# Patient Record
Sex: Female | Born: 1980 | Race: White | Hispanic: No | Marital: Single | Smoking: Current every day smoker
Health system: Southern US, Community
[De-identification: ages and names within clinical notes are randomized; demographics above are authoritative.]

## PROBLEM LIST (undated history)

## (undated) ENCOUNTER — Inpatient Hospital Stay (HOSPITAL_COMMUNITY): Payer: Self-pay

## (undated) DIAGNOSIS — J45909 Unspecified asthma, uncomplicated: Secondary | ICD-10-CM

## (undated) DIAGNOSIS — R011 Cardiac murmur, unspecified: Secondary | ICD-10-CM

## (undated) DIAGNOSIS — F1910 Other psychoactive substance abuse, uncomplicated: Secondary | ICD-10-CM

## (undated) DIAGNOSIS — R569 Unspecified convulsions: Secondary | ICD-10-CM

## (undated) HISTORY — PX: FINGER SURGERY: SHX640

## (undated) HISTORY — DX: Cardiac murmur, unspecified: R01.1

## (undated) HISTORY — PX: CYSTECTOMY: SUR359

---

## 1999-03-22 ENCOUNTER — Emergency Department (HOSPITAL_COMMUNITY): Admission: EM | Admit: 1999-03-22 | Discharge: 1999-03-23 | Payer: Self-pay | Admitting: Internal Medicine

## 1999-10-30 ENCOUNTER — Emergency Department (HOSPITAL_COMMUNITY): Admission: EM | Admit: 1999-10-30 | Discharge: 1999-10-30 | Payer: Self-pay | Admitting: Emergency Medicine

## 2000-07-07 ENCOUNTER — Emergency Department (HOSPITAL_COMMUNITY): Admission: EM | Admit: 2000-07-07 | Discharge: 2000-07-07 | Payer: Self-pay | Admitting: Emergency Medicine

## 2000-11-28 ENCOUNTER — Emergency Department (HOSPITAL_COMMUNITY): Admission: EM | Admit: 2000-11-28 | Discharge: 2000-11-28 | Payer: Self-pay | Admitting: Emergency Medicine

## 2002-01-19 ENCOUNTER — Inpatient Hospital Stay (HOSPITAL_COMMUNITY): Admission: AD | Admit: 2002-01-19 | Discharge: 2002-01-19 | Payer: Self-pay | Admitting: *Deleted

## 2002-03-19 ENCOUNTER — Inpatient Hospital Stay (HOSPITAL_COMMUNITY): Admission: AD | Admit: 2002-03-19 | Discharge: 2002-03-19 | Payer: Self-pay | Admitting: *Deleted

## 2002-06-02 ENCOUNTER — Inpatient Hospital Stay (HOSPITAL_COMMUNITY): Admission: AD | Admit: 2002-06-02 | Discharge: 2002-06-04 | Payer: Self-pay | Admitting: *Deleted

## 2002-06-02 ENCOUNTER — Encounter: Payer: Self-pay | Admitting: General Surgery

## 2002-06-03 ENCOUNTER — Encounter: Payer: Self-pay | Admitting: Obstetrics

## 2002-07-23 ENCOUNTER — Inpatient Hospital Stay (HOSPITAL_COMMUNITY): Admission: AD | Admit: 2002-07-23 | Discharge: 2002-07-23 | Payer: Self-pay | Admitting: Obstetrics

## 2002-09-05 ENCOUNTER — Inpatient Hospital Stay (HOSPITAL_COMMUNITY): Admission: RE | Admit: 2002-09-05 | Discharge: 2002-09-09 | Payer: Self-pay | Admitting: Obstetrics

## 2002-09-05 ENCOUNTER — Encounter: Payer: Self-pay | Admitting: Obstetrics

## 2002-09-06 DIAGNOSIS — O1490 Unspecified pre-eclampsia, unspecified trimester: Secondary | ICD-10-CM

## 2002-10-11 ENCOUNTER — Encounter: Payer: Self-pay | Admitting: Infectious Diseases

## 2002-10-11 ENCOUNTER — Inpatient Hospital Stay (HOSPITAL_COMMUNITY): Admission: EM | Admit: 2002-10-11 | Discharge: 2002-10-12 | Payer: Self-pay | Admitting: Emergency Medicine

## 2002-10-12 ENCOUNTER — Inpatient Hospital Stay (HOSPITAL_COMMUNITY): Admission: EM | Admit: 2002-10-12 | Discharge: 2002-10-16 | Payer: Self-pay | Admitting: Psychiatry

## 2003-03-29 ENCOUNTER — Emergency Department (HOSPITAL_COMMUNITY): Admission: EM | Admit: 2003-03-29 | Discharge: 2003-03-30 | Payer: Self-pay | Admitting: Emergency Medicine

## 2003-07-18 ENCOUNTER — Emergency Department (HOSPITAL_COMMUNITY): Admission: EM | Admit: 2003-07-18 | Discharge: 2003-07-18 | Payer: Self-pay | Admitting: Emergency Medicine

## 2003-07-21 ENCOUNTER — Emergency Department (HOSPITAL_COMMUNITY): Admission: EM | Admit: 2003-07-21 | Discharge: 2003-07-21 | Payer: Self-pay | Admitting: Emergency Medicine

## 2003-07-22 ENCOUNTER — Emergency Department (HOSPITAL_COMMUNITY): Admission: EM | Admit: 2003-07-22 | Discharge: 2003-07-22 | Payer: Self-pay | Admitting: Emergency Medicine

## 2003-08-22 ENCOUNTER — Emergency Department (HOSPITAL_COMMUNITY): Admission: EM | Admit: 2003-08-22 | Discharge: 2003-08-22 | Payer: Self-pay | Admitting: Emergency Medicine

## 2012-07-15 DIAGNOSIS — F1910 Other psychoactive substance abuse, uncomplicated: Secondary | ICD-10-CM | POA: Insufficient documentation

## 2012-07-15 DIAGNOSIS — J45909 Unspecified asthma, uncomplicated: Secondary | ICD-10-CM | POA: Insufficient documentation

## 2014-05-15 DIAGNOSIS — E890 Postprocedural hypothyroidism: Secondary | ICD-10-CM | POA: Insufficient documentation

## 2014-09-17 DIAGNOSIS — F988 Other specified behavioral and emotional disorders with onset usually occurring in childhood and adolescence: Secondary | ICD-10-CM | POA: Insufficient documentation

## 2014-09-17 DIAGNOSIS — R569 Unspecified convulsions: Secondary | ICD-10-CM | POA: Insufficient documentation

## 2014-09-17 DIAGNOSIS — Z720 Tobacco use: Secondary | ICD-10-CM | POA: Insufficient documentation

## 2014-09-17 DIAGNOSIS — F319 Bipolar disorder, unspecified: Secondary | ICD-10-CM | POA: Insufficient documentation

## 2015-04-30 ENCOUNTER — Other Ambulatory Visit (HOSPITAL_COMMUNITY): Payer: Self-pay | Admitting: Nurse Practitioner

## 2015-05-01 ENCOUNTER — Encounter (HOSPITAL_COMMUNITY): Payer: Self-pay

## 2015-05-01 ENCOUNTER — Other Ambulatory Visit (HOSPITAL_COMMUNITY): Payer: Self-pay | Admitting: Nurse Practitioner

## 2015-05-01 ENCOUNTER — Ambulatory Visit (HOSPITAL_COMMUNITY)
Admission: RE | Admit: 2015-05-01 | Discharge: 2015-05-01 | Disposition: A | Discharge status: Home / Self Care | Payer: Medicaid Other | Source: Ambulatory Visit | Attending: Family Medicine | Admitting: Family Medicine

## 2015-05-01 DIAGNOSIS — O0932 Supervision of pregnancy with insufficient antenatal care, second trimester: Secondary | ICD-10-CM | POA: Diagnosis not present

## 2015-05-01 DIAGNOSIS — F1110 Opioid abuse, uncomplicated: Secondary | ICD-10-CM

## 2015-05-01 DIAGNOSIS — O09522 Supervision of elderly multigravida, second trimester: Secondary | ICD-10-CM

## 2015-05-01 DIAGNOSIS — O99332 Smoking (tobacco) complicating pregnancy, second trimester: Secondary | ICD-10-CM

## 2015-05-01 DIAGNOSIS — Z3A15 15 weeks gestation of pregnancy: Secondary | ICD-10-CM

## 2015-05-01 DIAGNOSIS — Z3A19 19 weeks gestation of pregnancy: Secondary | ICD-10-CM

## 2015-05-01 DIAGNOSIS — O99312 Alcohol use complicating pregnancy, second trimester: Secondary | ICD-10-CM

## 2015-05-01 DIAGNOSIS — Z3492 Encounter for supervision of normal pregnancy, unspecified, second trimester: Secondary | ICD-10-CM

## 2015-05-01 DIAGNOSIS — O34211 Maternal care for low transverse scar from previous cesarean delivery: Secondary | ICD-10-CM

## 2015-05-01 DIAGNOSIS — O99352 Diseases of the nervous system complicating pregnancy, second trimester: Secondary | ICD-10-CM

## 2015-05-01 DIAGNOSIS — O99322 Drug use complicating pregnancy, second trimester: Secondary | ICD-10-CM

## 2015-05-01 DIAGNOSIS — Z36 Encounter for antenatal screening of mother: Secondary | ICD-10-CM | POA: Insufficient documentation

## 2015-05-01 DIAGNOSIS — Z3689 Encounter for other specified antenatal screening: Secondary | ICD-10-CM

## 2015-05-01 HISTORY — DX: Unspecified asthma, uncomplicated: J45.909

## 2015-05-01 HISTORY — DX: Other psychoactive substance abuse, uncomplicated: F19.10

## 2015-05-01 HISTORY — DX: Unspecified convulsions: R56.9

## 2015-05-01 NOTE — ED Notes (Signed)
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 Reports mild cramping at times.

## 2015-05-20 ENCOUNTER — Encounter: Payer: Medicaid Other | Admitting: Obstetrics & Gynecology

## 2015-05-23 ENCOUNTER — Ambulatory Visit (HOSPITAL_COMMUNITY): Payer: Medicaid Other | Attending: Family Medicine

## 2015-05-23 ENCOUNTER — Encounter (HOSPITAL_COMMUNITY): Payer: Medicaid Other

## 2015-05-30 ENCOUNTER — Encounter: Payer: Self-pay | Admitting: Family

## 2015-05-30 ENCOUNTER — Encounter: Payer: Medicaid Other | Admitting: Family

## 2015-07-15 ENCOUNTER — Other Ambulatory Visit (HOSPITAL_COMMUNITY)
Admission: RE | Admit: 2015-07-15 | Discharge: 2015-07-15 | Disposition: A | Discharge status: Home / Self Care | Payer: Medicaid Other | Source: Ambulatory Visit | Attending: Family Medicine | Admitting: Family Medicine

## 2015-07-15 ENCOUNTER — Encounter: Payer: Self-pay | Admitting: Family Medicine

## 2015-07-15 ENCOUNTER — Ambulatory Visit (INDEPENDENT_AMBULATORY_CARE_PROVIDER_SITE_OTHER): Payer: Medicaid Other | Admitting: Family Medicine

## 2015-07-15 VITALS — BP 104/46 | HR 63 | Temp 98.2°F | Ht 63.0 in | Wt 164.0 lb

## 2015-07-15 DIAGNOSIS — Z23 Encounter for immunization: Secondary | ICD-10-CM | POA: Diagnosis not present

## 2015-07-15 DIAGNOSIS — O0990 Supervision of high risk pregnancy, unspecified, unspecified trimester: Secondary | ICD-10-CM | POA: Insufficient documentation

## 2015-07-15 DIAGNOSIS — O99320 Drug use complicating pregnancy, unspecified trimester: Secondary | ICD-10-CM | POA: Insufficient documentation

## 2015-07-15 DIAGNOSIS — Z1151 Encounter for screening for human papillomavirus (HPV): Secondary | ICD-10-CM | POA: Diagnosis not present

## 2015-07-15 DIAGNOSIS — O99350 Diseases of the nervous system complicating pregnancy, unspecified trimester: Secondary | ICD-10-CM

## 2015-07-15 DIAGNOSIS — O09522 Supervision of elderly multigravida, second trimester: Secondary | ICD-10-CM | POA: Diagnosis not present

## 2015-07-15 DIAGNOSIS — O99352 Diseases of the nervous system complicating pregnancy, second trimester: Secondary | ICD-10-CM | POA: Diagnosis not present

## 2015-07-15 DIAGNOSIS — O34219 Maternal care for unspecified type scar from previous cesarean delivery: Secondary | ICD-10-CM | POA: Insufficient documentation

## 2015-07-15 DIAGNOSIS — G40909 Epilepsy, unspecified, not intractable, without status epilepticus: Secondary | ICD-10-CM | POA: Diagnosis not present

## 2015-07-15 DIAGNOSIS — O99322 Drug use complicating pregnancy, second trimester: Secondary | ICD-10-CM

## 2015-07-15 DIAGNOSIS — Z124 Encounter for screening for malignant neoplasm of cervix: Secondary | ICD-10-CM | POA: Diagnosis not present

## 2015-07-15 DIAGNOSIS — Z01419 Encounter for gynecological examination (general) (routine) without abnormal findings: Secondary | ICD-10-CM | POA: Diagnosis present

## 2015-07-15 DIAGNOSIS — Z113 Encounter for screening for infections with a predominantly sexual mode of transmission: Secondary | ICD-10-CM | POA: Insufficient documentation

## 2015-07-15 DIAGNOSIS — O09529 Supervision of elderly multigravida, unspecified trimester: Secondary | ICD-10-CM | POA: Insufficient documentation

## 2015-07-15 DIAGNOSIS — O0992 Supervision of high risk pregnancy, unspecified, second trimester: Secondary | ICD-10-CM

## 2015-07-15 LAB — POCT URINALYSIS DIP (DEVICE)
BILIRUBIN URINE: NEGATIVE
Glucose, UA: NEGATIVE mg/dL
Hgb urine dipstick: NEGATIVE
Ketones, ur: NEGATIVE mg/dL
NITRITE: NEGATIVE
PH: 6 (ref 5.0–8.0)
PROTEIN: NEGATIVE mg/dL
Specific Gravity, Urine: 1.01 (ref 1.005–1.030)
UROBILINOGEN UA: 0.2 mg/dL (ref 0.0–1.0)

## 2015-07-15 LAB — OB RESULTS CONSOLE GBS: GBS: POSITIVE

## 2015-07-15 MED ORDER — TETANUS-DIPHTH-ACELL PERTUSSIS 5-2.5-18.5 LF-MCG/0.5 IM SUSP
0.5000 mL | Freq: Once | INTRAMUSCULAR | Status: AC
  Administered 2015-07-15: 0.5 mL via INTRAMUSCULAR

## 2015-07-15 MED ORDER — PROMETHAZINE HCL 25 MG PO TABS: 25.0000 mg | ORAL_TABLET | Freq: Four times a day (QID) | ORAL | Status: AC | PRN

## 2015-07-15 MED ORDER — PRENATAL 27-0.8 MG PO TABS: 1.0000 | ORAL_TABLET | Freq: Every day | ORAL | Status: DC

## 2015-07-15 NOTE — Addendum Note (Signed)
Addended by: Reva BoresPRATT, Lealand Elting S on: 07/15/2015 10:53 AM   Modules accepted: Orders

## 2015-07-15 NOTE — Addendum Note (Signed)
Addended by: Kathee DeltonHILLMAN, Deaundra Dupriest L on: 07/15/2015 11:47 AM   Modules accepted: Orders

## 2015-07-15 NOTE — Progress Notes (Signed)
Reports a lot of nausea making it difficult to eat  New OB packet & 28 wk packet given

## 2015-07-15 NOTE — Patient Instructions (Signed)
Second Trimester of Pregnancy The second trimester is from week 13 through week 28, months 4 through 6. The second trimester is often a time when you feel your best. Your body has also adjusted to being pregnant, and you begin to feel better physically. Usually, morning sickness has lessened or quit completely, you may have more energy, and you may have an increase in appetite. The second trimester is also a time when the fetus is growing rapidly. At the end of the sixth month, the fetus is about 9 inches long and weighs about 1 pounds. You will likely begin to feel the baby move (quickening) between 18 and 20 weeks of the pregnancy. BODY CHANGES Your body goes through many changes during pregnancy. The changes vary from woman to woman.   Your weight will continue to increase. You will notice your lower abdomen bulging out.  You may begin to get stretch marks on your hips, abdomen, and breasts.  You may develop headaches that can be relieved by medicines approved by your health care provider.  You may urinate more often because the fetus is pressing on your bladder.  You may develop or continue to have heartburn as a result of your pregnancy.  You may develop constipation because certain hormones are causing the muscles that push waste through your intestines to slow down.  You may develop hemorrhoids or swollen, bulging veins (varicose veins).  You may have back pain because of the weight gain and pregnancy hormones relaxing your joints between the bones in your pelvis and as a result of a shift in weight and the muscles that support your balance.  Your breasts will continue to grow and be tender.  Your gums may bleed and may be sensitive to brushing and flossing.  Dark spots or blotches (chloasma, mask of pregnancy) may develop on your face. This will likely fade after the baby is born.  A dark line from your belly button to the pubic area (linea nigra) may appear. This will likely  fade after the baby is born.  You may have changes in your hair. These can include thickening of your hair, rapid growth, and changes in texture. Some women also have hair loss during or after pregnancy, or hair that feels dry or thin. Your hair will most likely return to normal after your baby is born. WHAT TO EXPECT AT YOUR PRENATAL VISITS During a routine prenatal visit:  You will be weighed to make sure you and the fetus are growing normally.  Your blood pressure will be taken.  Your abdomen will be measured to track your baby's growth.  The fetal heartbeat will be listened to.  Any test results from the previous visit will be discussed. Your health care provider may ask you:  How you are feeling.  If you are feeling the baby move.  If you have had any abnormal symptoms, such as leaking fluid, bleeding, severe headaches, or abdominal cramping.  If you are using any tobacco products, including cigarettes, chewing tobacco, and electronic cigarettes.  If you have any questions. Other tests that may be performed during your second trimester include:  Blood tests that check for:  Low iron levels (anemia).  Gestational diabetes (between 24 and 28 weeks).  Rh antibodies.  Urine tests to check for infections, diabetes, or protein in the urine.  An ultrasound to confirm the proper growth and development of the baby.  An amniocentesis to check for possible genetic problems.  Fetal screens for spina bifida   and Down syndrome.  HIV (human immunodeficiency virus) testing. Routine prenatal testing includes screening for HIV, unless you choose not to have this test. HOME CARE INSTRUCTIONS   Avoid all smoking, herbs, alcohol, and unprescribed drugs. These chemicals affect the formation and growth of the baby.  Do not use any tobacco products, including cigarettes, chewing tobacco, and electronic cigarettes. If you need help quitting, ask your health care provider. You may receive  counseling support and other resources to help you quit.  Follow your health care provider's instructions regarding medicine use. There are medicines that are either safe or unsafe to take during pregnancy.  Exercise only as directed by your health care provider. Experiencing uterine cramps is a good sign to stop exercising.  Continue to eat regular, healthy meals.  Wear a good support bra for breast tenderness.  Do not use hot tubs, steam rooms, or saunas.  Wear your seat belt at all times when driving.  Avoid raw meat, uncooked cheese, cat litter boxes, and soil used by cats. These carry germs that can cause birth defects in the baby.  Take your prenatal vitamins.  Take 1500-2000 mg of calcium daily starting at the 20th week of pregnancy until you deliver your baby.  Try taking a stool softener (if your health care provider approves) if you develop constipation. Eat more high-fiber foods, such as fresh vegetables or fruit and whole grains. Drink plenty of fluids to keep your urine clear or pale yellow.  Take warm sitz baths to soothe any pain or discomfort caused by hemorrhoids. Use hemorrhoid cream if your health care provider approves.  If you develop varicose veins, wear support hose. Elevate your feet for 15 minutes, 3-4 times a day. Limit salt in your diet.  Avoid heavy lifting, wear low heel shoes, and practice good posture.  Rest with your legs elevated if you have leg cramps or low back pain.  Visit your dentist if you have not gone yet during your pregnancy. Use a soft toothbrush to brush your teeth and be gentle when you floss.  A sexual relationship may be continued unless your health care provider directs you otherwise.  Continue to go to all your prenatal visits as directed by your health care provider. SEEK MEDICAL CARE IF:   You have dizziness.  You have mild pelvic cramps, pelvic pressure, or nagging pain in the abdominal area.  You have persistent nausea,  vomiting, or diarrhea.  You have a bad smelling vaginal discharge.  You have pain with urination. SEEK IMMEDIATE MEDICAL CARE IF:   You have a fever.  You are leaking fluid from your vagina.  You have spotting or bleeding from your vagina.  You have severe abdominal cramping or pain.  You have rapid weight gain or loss.  You have shortness of breath with chest pain.  You notice sudden or extreme swelling of your face, hands, ankles, feet, or legs.  You have not felt your baby move in over an hour.  You have severe headaches that do not go away with medicine.  You have vision changes.   This information is not intended to replace advice given to you by your health care provider. Make sure you discuss any questions you have with your health care provider.   Document Released: 06/23/2001 Document Revised: 07/20/2014 Document Reviewed: 08/30/2012 Elsevier Interactive Patient Education 2016 [employer]   Breastfeeding Deciding to breastfeed is one of the best choices you can make for you and your baby. A change   in hormones during pregnancy causes your breast tissue to grow and increases the number and size of your milk ducts. These hormones also allow proteins, sugars, and fats from your blood supply to make breast milk in your milk-producing glands. Hormones prevent breast milk from being released before your baby is born as well as prompt milk flow after birth. Once breastfeeding has begun, thoughts of your baby, as well as his or her sucking or crying, can stimulate the release of milk from your milk-producing glands.  BENEFITS OF BREASTFEEDING For Your Baby  Your first milk (colostrum) helps your baby's digestive system function better.  There are antibodies in your milk that help your baby fight off infections.  Your baby has a lower incidence of asthma, allergies, and sudden infant death syndrome.  The nutrients in breast milk are better for your baby than infant  formulas and are designed uniquely for your baby's needs.  Breast milk improves your baby's brain development.  Your baby is less likely to develop other conditions, such as childhood obesity, asthma, or type 2 diabetes mellitus. For You  Breastfeeding helps to create a very special bond between you and your baby.  Breastfeeding is convenient. Breast milk is always available at the correct temperature and costs nothing.  Breastfeeding helps to burn calories and helps you lose the weight gained during pregnancy.  Breastfeeding makes your uterus contract to its prepregnancy size faster and slows bleeding (lochia) after you give birth.   Breastfeeding helps to lower your risk of developing type 2 diabetes mellitus, osteoporosis, and breast or ovarian cancer later in life. SIGNS THAT YOUR BABY IS HUNGRY Early Signs of Hunger  Increased alertness or activity.  Stretching.  Movement of the head from side to side.  Movement of the head and opening of the mouth when the corner of the mouth or cheek is stroked (rooting).  Increased sucking sounds, smacking lips, cooing, sighing, or squeaking.  Hand-to-mouth movements.  Increased sucking of fingers or hands. Late Signs of Hunger  Fussing.  Intermittent crying. Extreme Signs of Hunger Signs of extreme hunger will require calming and consoling before your baby will be able to breastfeed successfully. Do not wait for the following signs of extreme hunger to occur before you initiate breastfeeding:  Restlessness.  A loud, strong cry.  Screaming. BREASTFEEDING BASICS Breastfeeding Initiation  Find a comfortable place to sit or lie down, with your neck and back well supported.  Place a pillow or rolled up blanket under your baby to bring him or her to the level of your breast (if you are seated). Nursing pillows are specially designed to help support your arms and your baby while you breastfeed.  Make sure that your baby's  abdomen is facing your abdomen.  Gently massage your breast. With your fingertips, massage from your chest wall toward your nipple in a circular motion. This encourages milk flow. You may need to continue this action during the feeding if your milk flows slowly.  Support your breast with 4 fingers underneath and your thumb above your nipple. Make sure your fingers are well away from your nipple and your baby's mouth.  Stroke your baby's lips gently with your finger or nipple.  When your baby's mouth is open wide enough, quickly bring your baby to your breast, placing your entire nipple and as much of the colored area around your nipple (areola) as possible into your baby's mouth.  More areola should be visible above your baby's upper lip than   below the lower lip.  Your baby's tongue should be between his or her lower gum and your breast.  Ensure that your baby's mouth is correctly positioned around your nipple (latched). Your baby's lips should create a seal on your breast and be turned out (everted).  It is common for your baby to suck about 2-3 minutes in order to start the flow of breast milk. Latching Teaching your baby how to latch on to your breast properly is very important. An improper latch can cause nipple pain and decreased milk supply for you and poor weight gain in your baby. Also, if your baby is not latched onto your nipple properly, he or she may swallow some air during feeding. This can make your baby fussy. Burping your baby when you switch breasts during the feeding can help to get rid of the air. However, teaching your baby to latch on properly is still the best way to prevent fussiness from swallowing air while breastfeeding. Signs that your baby has successfully latched on to your nipple:  Silent tugging or silent sucking, without causing you pain.  Swallowing heard between every 3-4 sucks.  Muscle movement above and in front of his or her ears while sucking. Signs  that your baby has not successfully latched on to nipple:  Sucking sounds or smacking sounds from your baby while breastfeeding.  Nipple pain. If you think your baby has not latched on correctly, slip your finger into the corner of your baby's mouth to break the suction and place it between your baby's gums. Attempt breastfeeding initiation again. Signs of Successful Breastfeeding Signs from your baby:  A gradual decrease in the number of sucks or complete cessation of sucking.  Falling asleep.  Relaxation of his or her body.  Retention of a small amount of milk in his or her mouth.  Letting go of your breast by himself or herself. Signs from you:  Breasts that have increased in firmness, weight, and size 1-3 hours after feeding.  Breasts that are softer immediately after breastfeeding.  Increased milk volume, as well as a change in milk consistency and color by the fifth day of breastfeeding.  Nipples that are not sore, cracked, or bleeding. Signs That Your Baby is Getting Enough Milk  Wetting at least 3 diapers in a 24-hour period. The urine should be clear and pale yellow by age 5 days.  At least 3 stools in a 24-hour period by age 5 days. The stool should be soft and yellow.  At least 3 stools in a 24-hour period by age 7 days. The stool should be seedy and yellow.  No loss of weight greater than 10% of birth weight during the first 3 days of age.  Average weight gain of 4-7 ounces (113-198 g) per week after age 4 days.  Consistent daily weight gain by age 5 days, without weight loss after the age of 2 weeks. After a feeding, your baby may spit up a small amount. This is common. BREASTFEEDING FREQUENCY AND DURATION Frequent feeding will help you make more milk and can prevent sore nipples and breast engorgement. Breastfeed when you feel the need to reduce the fullness of your breasts or when your baby shows signs of hunger. This is called "breastfeeding on demand." Avoid  introducing a pacifier to your baby while you are working to establish breastfeeding (the first 4-6 weeks after your baby is born). After this time you may choose to use a pacifier. Research has shown that   pacifier use during the first year of a baby's life decreases the risk of sudden infant death syndrome (SIDS). Allow your baby to feed on each breast as long as he or she wants. Breastfeed until your baby is finished feeding. When your baby unlatches or falls asleep while feeding from the first breast, offer the second breast. Because newborns are often sleepy in the first few weeks of life, you may need to awaken your baby to get him or her to feed. Breastfeeding times will vary from baby to baby. However, the following rules can serve as a guide to help you ensure that your baby is properly fed:  Newborns (babies 4 weeks of age or younger) may breastfeed every 1-3 hours.  Newborns should not go longer than 3 hours during the day or 5 hours during the night without breastfeeding.  You should breastfeed your baby a minimum of 8 times in a 24-hour period until you begin to introduce solid foods to your baby at around 6 months of age. BREAST MILK PUMPING Pumping and storing breast milk allows you to ensure that your baby is exclusively fed your breast milk, even at times when you are unable to breastfeed. This is especially important if you are going back to work while you are still breastfeeding or when you are not able to be present during feedings. Your lactation consultant can give you guidelines on how long it is safe to store breast milk. A breast pump is a machine that allows you to pump milk from your breast into a sterile bottle. The pumped breast milk can then be stored in a refrigerator or freezer. Some breast pumps are operated by hand, while others use electricity. Ask your lactation consultant which type will work best for you. Breast pumps can be purchased, but some hospitals and  breastfeeding support groups lease breast pumps on a monthly basis. A lactation consultant can teach you how to hand express breast milk, if you prefer not to use a pump. CARING FOR YOUR BREASTS WHILE YOU BREASTFEED Nipples can become dry, cracked, and sore while breastfeeding. The following recommendations can help keep your breasts moisturized and healthy:  Avoid using soap on your nipples.  Wear a supportive bra. Although not required, special nursing bras and tank tops are designed to allow access to your breasts for breastfeeding without taking off your entire bra or top. Avoid wearing underwire-style bras or extremely tight bras.  Air dry your nipples for 3-4minutes after each feeding.  Use only cotton bra pads to absorb leaked breast milk. Leaking of breast milk between feedings is normal.  Use lanolin on your nipples after breastfeeding. Lanolin helps to maintain your skin's normal moisture barrier. If you use pure lanolin, you do not need to wash it off before feeding your baby again. Pure lanolin is not toxic to your baby. You may also hand express a few drops of breast milk and gently massage that milk into your nipples and allow the milk to air dry. In the first few weeks after giving birth, some women experience extremely full breasts (engorgement). Engorgement can make your breasts feel heavy, warm, and tender to the touch. Engorgement peaks within 3-5 days after you give birth. The following recommendations can help ease engorgement:  Completely empty your breasts while breastfeeding or pumping. You may want to start by applying warm, moist heat (in the shower or with warm water-soaked hand towels) just before feeding or pumping. This increases circulation and helps the milk   flow. If your baby does not completely empty your breasts while breastfeeding, pump any extra milk after he or she is finished.  Wear a snug bra (nursing or regular) or tank top for 1-2 days to signal your body  to slightly decrease milk production.  Apply ice packs to your breasts, unless this is too uncomfortable for you.  Make sure that your baby is latched on and positioned properly while breastfeeding. If engorgement persists after 48 hours of following these recommendations, contact your health care provider or a lactation consultant. OVERALL HEALTH CARE RECOMMENDATIONS WHILE BREASTFEEDING  Eat healthy foods. Alternate between meals and snacks, eating 3 of each per day. Because what you eat affects your breast milk, some of the foods may make your baby more irritable than usual. Avoid eating these foods if you are sure that they are negatively affecting your baby.  Drink milk, fruit juice, and water to satisfy your thirst (about 10 glasses a day).  Rest often, relax, and continue to take your prenatal vitamins to prevent fatigue, stress, and anemia.  Continue breast self-awareness checks.  Avoid chewing and smoking tobacco. Chemicals from cigarettes that pass into breast milk and exposure to secondhand smoke may harm your baby.  Avoid alcohol and drug use, including marijuana. Some medicines that may be harmful to your baby can pass through breast milk. It is important to ask your health care provider before taking any medicine, including all over-the-counter and prescription medicine as well as vitamin and herbal supplements. It is possible to become pregnant while breastfeeding. If birth control is desired, ask your health care provider about options that will be safe for your baby. SEEK MEDICAL CARE IF:  You feel like you want to stop breastfeeding or have become frustrated with breastfeeding.  You have painful breasts or nipples.  Your nipples are cracked or bleeding.  Your breasts are red, tender, or warm.  You have a swollen area on either breast.  You have a fever or chills.  You have nausea or vomiting.  You have drainage other than breast milk from your nipples.  Your  breasts do not become full before feedings by the fifth day after you give birth.  You feel sad and depressed.  Your baby is too sleepy to eat well.  Your baby is having trouble sleeping.   Your baby is wetting less than 3 diapers in a 24-hour period.  Your baby has less than 3 stools in a 24-hour period.  Your baby's skin or the white part of his or her eyes becomes yellow.   Your baby is not gaining weight by 5 days of age. SEEK IMMEDIATE MEDICAL CARE IF:  Your baby is overly tired (lethargic) and does not want to wake up and feed.  Your baby develops an unexplained fever.   This information is not intended to replace advice given to you by your health care provider. Make sure you discuss any questions you have with your health care provider.   Document Released: 06/29/2005 Document Revised: 03/20/2015 Document Reviewed: 12/21/2012 Elsevier Interactive Patient Education 2016 [employer] Inc.  

## 2015-07-15 NOTE — Progress Notes (Signed)
Subjective:    Jeanette Obrien is a [id number] [redacted]w[redacted]d being seen today for her first obstetrical visit.  Her obstetrical history is significant for advanced maternal age, smoker and late to care, previous C-section, methadone use. . Pregnancy history fully reviewed.  Patient reports no complaints.  Filed Vitals:   07/15/15 0850 07/15/15 0851  BP: 104/46   Pulse: 63   Temp: 98.2 F (36.8 C)   Height:  5\' 3"  (1.6 m)  Weight: 164 lb (74.39 kg)     HISTORY: OB History  Gravida Para Term Preterm AB SAB TAB Ectopic Multiple Living  3 1 1  0 1 0 1 0 0 2    # Outcome Date GA Lbr Len/2nd Weight Sex Delivery Anes PTL Lv  3 Current           2 Term 09/06/02 [redacted]w[redacted]d  5 lb 5 oz (2.41 kg) F CS-Unspec EPI N Y     Complications: Toxemia of pregnancy  1 TAB              Past Medical History  Diagnosis Date  . Seizures (HCC)   . Asthma   . Substance abuse   . Heart murmur    Past Surgical History  Procedure Laterality Date  . Cesarean section    . Finger surgery     Family History  Problem Relation Age of Onset  . Heart disease Mother   . Seizures Mother   . Asthma Mother   . COPD Mother   . Diabetes Maternal Aunt      Exam    Uterus:   FH 26  Pelvic Exam:    Perineum: No Hemorrhoids, Normal Perineum   Vulva: normal, Bartholin's, Urethra, Skene's normal   Vagina:  normal mucosa, normal discharge   Cervix: no bleeding following Pap, no cervical motion tenderness and no lesions   Adnexa: normal adnexa   Bony Pelvis: average  System: Breast:  normal appearance, no masses or tenderness   Skin: normal coloration and turgor, no rashes    Neurologic: oriented   Extremities: normal strength, tone, and muscle mass, no deformities   HEENT extra ocular movement intact and sclera clear, anicteric   Mouth/Teeth mucous membranes moist, pharynx normal without lesions    Neck supple   Cardiovascular: regular rate and rhythm, no murmurs or gallops   Respiratory:  appears well, vitals  normal, no respiratory distress, acyanotic, normal RR, ear and throat exam is normal, neck free of mass or lymphadenopathy Diffuse wheezing noted   Abdomen: soft, non-tender; bowel sounds normal; no masses,  no organomegaly      Assessment/Plan:    Pregnancy: [id number] 1. Supervision of high risk pregnancy in second trimester - Prenatal Profile - Culture, OB Urine - Prescript Monitor Profile(19) - Cystic fibrosis diagnostic study - Cytology - PAP - GC/Chlamydia probe amp ([place])not at [facility] - [place] MFM OB DETAIL +14 WK; Future - Flu Vaccine QUAD 36+ mos IM; Standing - Tdap (BOOSTRIX) injection 0.5 mL; Inject 0.5 mLs into the muscle once. - Flu Vaccine QUAD 36+ mos IM - [place] MFM OB COMP + 14 WK; Future - promethazine (PHENERGAN) 25 MG tablet; Take 1 tablet (25 mg total) by mouth every 6 (six) hours as needed for nausea.  Dispense: 42 tablet; Refill: 2  2. Seizure disorder in pregnancy, antepartum, second trimester (HCC) Continue keppra  3. Previous cesarean delivery, antepartum Need to discuss mode of delivery  4. Drug use affecting pregnancy, antepartum, second trimester Continue  methadone  5. AMA (advanced maternal age) multigravida 35+, second trimester - AMB referral to maternal fetal medicine for genetics   [name] 07/15/2015

## 2015-07-16 NOTE — Addendum Note (Signed)
Addended by: Cheree DittoGRAHAM, Giavanni Zeitlin A on: 07/16/2015 09:09 AM   Modules accepted: Orders

## 2015-07-17 LAB — GC/CHLAMYDIA PROBE AMP (~~LOC~~) NOT AT ARMC
Chlamydia: NEGATIVE
Neisseria Gonorrhea: NEGATIVE

## 2015-07-17 LAB — CYTOLOGY - PAP

## 2015-07-18 ENCOUNTER — Other Ambulatory Visit: Payer: Medicaid Other

## 2015-07-18 ENCOUNTER — Encounter: Payer: Self-pay | Admitting: Family Medicine

## 2015-07-18 ENCOUNTER — Telehealth: Payer: Self-pay | Admitting: Obstetrics & Gynecology

## 2015-07-18 ENCOUNTER — Telehealth: Payer: Self-pay | Admitting: General Practice

## 2015-07-18 DIAGNOSIS — O2340 Unspecified infection of urinary tract in pregnancy, unspecified trimester: Secondary | ICD-10-CM

## 2015-07-18 DIAGNOSIS — O2342 Unspecified infection of urinary tract in pregnancy, second trimester: Principal | ICD-10-CM

## 2015-07-18 DIAGNOSIS — B951 Streptococcus, group B, as the cause of diseases classified elsewhere: Secondary | ICD-10-CM | POA: Insufficient documentation

## 2015-07-18 LAB — PRESCRIPTION MONITORING PROFILE (19 PANEL)
Amphetamine/Meth: NEGATIVE ng/mL
BENZODIAZEPINE SCREEN, URINE: NEGATIVE ng/mL
BUPRENORPHINE, URINE: NEGATIVE ng/mL
Barbiturate Screen, Urine: NEGATIVE ng/mL
CANNABINOID SCRN UR: NEGATIVE ng/mL
COCAINE METABOLITES: NEGATIVE ng/mL
CREATININE, URINE: 40.81 mg/dL (ref >20.0)
Carisoprodol, Urine: NEGATIVE ng/mL
ECSTASY: NEGATIVE ng/mL
FENTANYL URINE: NEGATIVE ng/mL
MEPERIDINE UR: NEGATIVE ng/mL
METHAQUALONE SCREEN (URINE): NEGATIVE ng/mL
NITRITES URINE, INITIAL: NEGATIVE ug/mL
Opiate Screen, Urine: NEGATIVE ng/mL
Oxycodone Screen, Ur: NEGATIVE ng/mL
PH URINE, INITIAL: 6.6 pH (ref 4.5–8.9)
PHENCYCLIDINE, UR: NEGATIVE ng/mL
PROPOXYPHENE: NEGATIVE ng/mL
Tapentadol, urine: NEGATIVE ng/mL
Tramadol Scrn, Ur: NEGATIVE ng/mL
ZOLPIDEM, URINE: NEGATIVE ng/mL

## 2015-07-18 LAB — CULTURE, OB URINE

## 2015-07-18 LAB — METHADONE (GC/LC/MS), URINE
EDDPUC: 1862 ng/mL — AB (ref <100)
METHADONEUC: 453 ng/mL — AB (ref <100)

## 2015-07-18 MED ORDER — PENICILLIN V POTASSIUM 500 MG PO TABS: 500.0000 mg | ORAL_TABLET | Freq: Four times a day (QID) | ORAL | Status: DC

## 2015-07-18 NOTE — Telephone Encounter (Signed)
Called patient, but her voicemail was full.

## 2015-07-18 NOTE — Telephone Encounter (Signed)
Per Dr Shawnie PonsPratt, patient has GBS UTI and needs Penicillin VK 500mg  qid x 7 days. Called patient, no answer- unable to leave message due to voicemail box full.

## 2015-07-19 NOTE — Telephone Encounter (Signed)
Attempted to call patient mailbox was full 

## 2015-07-22 ENCOUNTER — Encounter: Payer: Self-pay | Admitting: General Practice

## 2015-07-22 NOTE — Telephone Encounter (Signed)
Called patient, no answer- unable to leave message due to voicemail box full. Will send letter 

## 2015-07-24 ENCOUNTER — Ambulatory Visit (HOSPITAL_COMMUNITY): Payer: Medicaid Other | Attending: Family Medicine

## 2015-08-01 ENCOUNTER — Encounter: Payer: Medicaid Other | Admitting: Family Medicine

## 2015-08-10 ENCOUNTER — Encounter (HOSPITAL_COMMUNITY): Payer: Self-pay | Admitting: *Deleted

## 2015-08-10 ENCOUNTER — Inpatient Hospital Stay (HOSPITAL_COMMUNITY)
Admission: AD | Admit: 2015-08-10 | Discharge: 2015-08-10 | Disposition: A | Discharge status: Home / Self Care | Payer: Medicaid Other | Source: Ambulatory Visit | Attending: Obstetrics & Gynecology | Admitting: Obstetrics & Gynecology

## 2015-08-10 DIAGNOSIS — B951 Streptococcus, group B, as the cause of diseases classified elsewhere: Secondary | ICD-10-CM

## 2015-08-10 DIAGNOSIS — O99352 Diseases of the nervous system complicating pregnancy, second trimester: Secondary | ICD-10-CM

## 2015-08-10 DIAGNOSIS — Z3A29 29 weeks gestation of pregnancy: Secondary | ICD-10-CM | POA: Diagnosis not present

## 2015-08-10 DIAGNOSIS — R600 Localized edema: Secondary | ICD-10-CM

## 2015-08-10 DIAGNOSIS — R569 Unspecified convulsions: Secondary | ICD-10-CM

## 2015-08-10 DIAGNOSIS — O0992 Supervision of high risk pregnancy, unspecified, second trimester: Secondary | ICD-10-CM

## 2015-08-10 DIAGNOSIS — O09522 Supervision of elderly multigravida, second trimester: Secondary | ICD-10-CM

## 2015-08-10 DIAGNOSIS — O99322 Drug use complicating pregnancy, second trimester: Secondary | ICD-10-CM

## 2015-08-10 DIAGNOSIS — F1910 Other psychoactive substance abuse, uncomplicated: Secondary | ICD-10-CM

## 2015-08-10 DIAGNOSIS — O34219 Maternal care for unspecified type scar from previous cesarean delivery: Secondary | ICD-10-CM

## 2015-08-10 DIAGNOSIS — Z720 Tobacco use: Secondary | ICD-10-CM

## 2015-08-10 DIAGNOSIS — O99333 Smoking (tobacco) complicating pregnancy, third trimester: Secondary | ICD-10-CM | POA: Diagnosis not present

## 2015-08-10 DIAGNOSIS — O1203 Gestational edema, third trimester: Secondary | ICD-10-CM | POA: Insufficient documentation

## 2015-08-10 DIAGNOSIS — F319 Bipolar disorder, unspecified: Secondary | ICD-10-CM

## 2015-08-10 DIAGNOSIS — F988 Other specified behavioral and emotional disorders with onset usually occurring in childhood and adolescence: Secondary | ICD-10-CM

## 2015-08-10 DIAGNOSIS — F17210 Nicotine dependence, cigarettes, uncomplicated: Secondary | ICD-10-CM | POA: Diagnosis not present

## 2015-08-10 DIAGNOSIS — G40909 Epilepsy, unspecified, not intractable, without status epilepticus: Secondary | ICD-10-CM

## 2015-08-10 DIAGNOSIS — O2343 Unspecified infection of urinary tract in pregnancy, third trimester: Secondary | ICD-10-CM

## 2015-08-10 LAB — URINALYSIS, ROUTINE W REFLEX MICROSCOPIC
Bilirubin Urine: NEGATIVE
Glucose, UA: NEGATIVE mg/dL
Hgb urine dipstick: NEGATIVE
Ketones, ur: NEGATIVE mg/dL
LEUKOCYTES UA: NEGATIVE
NITRITE: NEGATIVE
PROTEIN: NEGATIVE mg/dL
Specific Gravity, Urine: <1.005 — ABNORMAL LOW (ref 1.005–1.030)
pH: 6 (ref 5.0–8.0)

## 2015-08-10 NOTE — Discharge Instructions (Signed)
Prenatal Care °WHAT IS PRENATAL CARE?  °Prenatal care is the process of caring for a pregnant woman before she gives birth. Prenatal care makes sure that she and her baby remain as healthy as possible throughout pregnancy. Prenatal care may be provided by a midwife, family practice health care provider, or a childbirth and pregnancy specialist (obstetrician). Prenatal care may include physical examinations, testing, treatments, and education on nutrition, lifestyle, and social support services. °WHY IS PRENATAL CARE SO IMPORTANT?  °Early and consistent prenatal care increases the chance that you and your baby will remain healthy throughout your pregnancy. This type of care also decreases a baby's risk of being born too early (prematurely), or being born smaller than expected (small for gestational age). Any underlying medical conditions you may have that could pose a risk during your pregnancy are discussed during prenatal care visits. You will also be monitored regularly for any new conditions that may arise during your pregnancy so they can be treated quickly and effectively. °WHAT HAPPENS DURING PRENATAL CARE VISITS? °Prenatal care visits may include the following: °Discussion °Tell your health care provider about any new signs or symptoms you have experienced since your last visit. These might include: °· Nausea or vomiting. °· Increased or decreased level of energy. °· Difficulty sleeping. °· Back or leg pain. °· Weight changes. °· Frequent urination. °· Shortness of breath with physical activity. °· Changes in your skin, such as the development of a rash or itchiness. °· Vaginal discharge or bleeding. °· Feelings of excitement or nervousness. °· Changes in your baby's movements. °You may want to write down any questions or topics you want to discuss with your health care provider and bring them with you to your appointment. °Examination °During your first prenatal care visit, you will likely have a complete  physical exam. Your health care provider will often examine your vagina, cervix, and the position of your uterus, as well as check your heart, lungs, and other body systems. As your pregnancy progresses, your health care provider will measure the size of your uterus and your baby's position inside your uterus. He or she may also examine you for early signs of labor. Your prenatal visits may also include checking your blood pressure and, after about 10-12 weeks of pregnancy, listening to your baby's heartbeat. °Testing °Regular testing often includes: °· Urinalysis. This checks your urine for glucose, protein, or signs of infection. °· Blood count. This checks the levels of white and red blood cells in your body. °· Tests for sexually transmitted infections (STIs). Testing for STIs at the beginning of pregnancy is routinely done and is required in many states. °· Antibody testing. You will be checked to see if you are immune to certain illnesses, such as rubella, that can affect a developing fetus. °· Glucose screen. Around 24-28 weeks of pregnancy, your blood glucose level will be checked for signs of gestational diabetes. Follow-up tests may be recommended. °· Group B strep. This is a bacteria that is commonly found inside a woman's vagina. This test will inform your health care provider if you need an antibiotic to reduce the amount of this bacteria in your body prior to labor and childbirth. °· Ultrasound. Many pregnant women undergo an ultrasound screening around 18-20 weeks of pregnancy to evaluate the health of the fetus and check for any developmental abnormalities. °· HIV (human immunodeficiency virus) testing. Early in your pregnancy, you will be screened for HIV. If you are at high risk for HIV, this test   may be repeated during your third trimester of pregnancy. °You may be offered other testing based on your age, personal or family medical history, or other factors.  °HOW OFTEN SHOULD I PLAN TO SEE MY  HEALTH CARE PROVIDER FOR PRENATAL CARE? °Your prenatal care check-up schedule depends on any medical conditions you have before, or develop during, your pregnancy. If you do not have any underlying medical conditions, you will likely be seen for checkups: °· Monthly, during the first 6 months of pregnancy. °· Twice a month during months 7 and 8 of pregnancy. °· Weekly starting in the 9th month of pregnancy and until delivery. °If you develop signs of early labor or other concerning signs or symptoms, you may need to see your health care provider more often. Ask your health care provider what prenatal care schedule is best for you. °WHAT CAN I DO TO KEEP MYSELF AND MY BABY AS HEALTHY AS POSSIBLE DURING MY PREGNANCY? °· Take a prenatal vitamin containing 400 micrograms (0.4 mg) of folic acid every day. Your health care provider may also ask you to take additional vitamins such as iodine, vitamin D, iron, copper, and zinc. °· Take 1500-2000 mg of calcium daily starting at your 20th week of pregnancy until you deliver your baby. °· Make sure you are up to date on your vaccinations. Unless directed otherwise by your health care provider: °¨ You should receive a tetanus, diphtheria, and pertussis (Tdap) vaccination between the 27th and 36th week of your pregnancy, regardless of when your last Tdap immunization occurred. This helps protect your baby from whooping cough (pertussis) after he or she is born. °¨ You should receive an annual inactivated influenza vaccine (IIV) to help protect you and your baby from influenza. This can be done at any point during your pregnancy. °· Eat a well-rounded diet that includes: °¨ Fresh fruits and vegetables. °¨ Lean proteins. °¨ Calcium-rich foods such as milk, yogurt, hard cheeses, and dark, leafy greens. °¨ Whole grain breads. °· Do not eat seafood high in mercury, including: °¨ Swordfish. °¨ Tilefish. °¨ Shark. °¨ King mackerel. °¨ More than 6 oz tuna per week. °· Do not eat: °¨ Raw  or undercooked meats or eggs. °¨ Unpasteurized foods, such as soft cheeses (brie, blue, or feta), juices, and milks. °¨ Lunch meats. °¨ Hot dogs that have not been heated until they are steaming. °· Drink enough water to keep your urine clear or pale yellow. For many women, this may be 10 or more 8 oz glasses of water each day. Keeping yourself hydrated helps deliver nutrients to your baby and may prevent the start of pre-term uterine contractions. °· Do not use any tobacco products including cigarettes, chewing tobacco, or electronic cigarettes. If you need help quitting, ask your health care provider. °· Do not drink beverages containing alcohol. No safe level of alcohol consumption during pregnancy has been determined. °· Do not use any illegal drugs. These can harm your developing baby or cause a miscarriage. °· Ask your health care provider or pharmacist before taking any prescription or over-the-counter medicines, herbs, or supplements. °· Limit your caffeine intake to no more than 200 mg per day. °· Exercise. Unless told otherwise by your health care provider, try to get 30 minutes of moderate exercise most days of the week. Do not  do high-impact activities, contact sports, or activities with a high risk of falling, such as horseback riding or downhill skiing. °· Get plenty of rest. °· Avoid anything that raises your   body temperature, such as hot tubs and saunas. °· If you own a cat, do not empty its litter box. Bacteria contained in cat feces can cause an infection called toxoplasmosis. This can result in serious harm to the fetus. °· Stay away from chemicals such as insecticides, lead, mercury, and cleaning or paint products that contain solvents. °· Do not have any X-rays taken unless medically necessary. °· Take a childbirth and breastfeeding preparation class. Ask your health care provider if you need a referral or recommendation. °  °This information is not intended to replace advice given to you by  your health care provider. Make sure you discuss any questions you have with your health care provider. °  °Document Released: 07/02/2003 Document Revised: 07/20/2014 Document Reviewed: 09/13/2013 °Elsevier Interactive Patient Education ©2016 [employer] ° °

## 2015-08-10 NOTE — MAU Provider Note (Signed)
MAU HISTORY AND PHYSICAL  Chief Complaint:  Finger swelling missed appointments    Jeanette Obrien is a 35 y.o.  [id number] with IUP at [redacted]w[redacted]d presenting for Finger swelling missed appointments   Intermittent hand and feet swelling. No ha, vision change, ruq pain, or sob.  Difficult stick, tried to get prenatal labs last visit, couldn't get, missed f/u appointment. 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   Past Medical History  Diagnosis Date  . Seizures (HCC)   . Asthma   . Substance abuse   . Heart murmur     Past Surgical History  Procedure Laterality Date  . Cesarean section    . Finger surgery      Family History  Problem Relation Age of Onset  . Heart disease Mother   . Seizures Mother   . Asthma Mother   . COPD Mother   . Diabetes Maternal Aunt     Social History  Substance Use Topics  . Smoking status: Current Every Day Smoker -- 0.50 packs/day    Types: Cigarettes  . Smokeless tobacco: Never Used  . Alcohol Use: No    Allergies  Allergen Reactions  . Morphine And Related Hives  . Sulfa Antibiotics Swelling and Other (See Comments)    blisters  . Toradol [Ketorolac Tromethamine] Other (See Comments)    Stomach upset    Prescriptions prior to admission  Medication Sig Dispense Refill Last Dose  . GABAPENTIN PO Take 600 mg by mouth 3 (three) times daily.    08/10/2015 at Unknown time  . LevETIRAcetam (KEPPRA PO) Take by mouth daily. Unsure of dose   08/10/2015 at Unknown time  . methadone (DOLOPHINE) 5 MG tablet Take 90 mg by mouth daily.    08/10/2015 at Unknown time  . Sertraline HCl (ZOLOFT PO) Take 100 mg by mouth daily.    08/10/2015 at Unknown time  . ALBUTEROL IN Inhale into the lungs.   Taking  . Budesonide-Formoterol Fumarate (SYMBICORT IN) Inhale into the lungs.   Taking  . penicillin v potassium (VEETID) 500 MG tablet Take 1 tablet (500 mg total) by mouth 4 (four)  times daily. 28 tablet 0   . Prenatal Vit-Fe Fumarate-FA (MULTIVITAMIN-PRENATAL) 27-0.8 MG TABS tablet Take 1 tablet by mouth daily. 90 each 2   . promethazine (PHENERGAN) 25 MG tablet Take 1 tablet (25 mg total) by mouth every 6 (six) hours as needed for nausea. 42 tablet 2     Review of Systems - Negative except for what is mentioned in HPI.  Physical Exam  Blood pressure 132/70, pulse 105, temperature 98.5 F (36.9 C), temperature source Oral, resp. rate 18, height  (1.6 m), weight 162 lb (73.483 kg). GENERAL: Well-developed, well-nourished female in no acute distress.  LUNGS: Clear to auscultation bilaterally.  HEART: Regular rate and rhythm. ABDOMEN: Soft, nontender, nondistended, gravid.  EXTREMITIES: Nontender, no edema, 2+ distal pulses. Cervical Exam: declined FHT:  140/mod/+a/-d Contractions: toco quiet Extremities: trace pitting edema to mid-calves   Labs: Results for orders placed or performed during the hospital encounter of 08/10/15 (from the past 24 hour(s))  Urinalysis, Routine w reflex microscopic (not at [facility])   Collection Time: 08/10/15  2:25 PM  Result Value Ref Range   Color, Urine YELLOW YELLOW   APPearance HAZY (A) CLEAR   Specific Gravity, Urine <1.005 (L) 1.005 - 1.030   pH 6.0 5.0 - 8.0   Glucose,  UA NEGATIVE NEGATIVE mg/dL   Hgb urine dipstick NEGATIVE NEGATIVE   Bilirubin Urine NEGATIVE NEGATIVE   Ketones, ur NEGATIVE NEGATIVE mg/dL   Protein, ur NEGATIVE NEGATIVE mg/dL   Nitrite NEGATIVE NEGATIVE   Leukocytes, UA NEGATIVE NEGATIVE    Imaging Studies:  No results found.  Assessment: Jeanette Obrien is  35 y.o. [phone] at [redacted]w[redacted]d presents with Finger swelling missed appointments    Plan:  # Edema: I explained that her history of IV drug use predisposes to venous and lymphatic obstruction and the development of edema, no doubt made worse by pregnancy. BP wnl, no other symptoms preeclampsia, and no protein in urinalysis, so do not think this  represents preeclampsia - support hose, elevation, daily exercise, preeclampsia return precautions  # Prenatal labs - I offered to order prenatal lab panel but explained that we do not perform the 1-hour glucola, the patient declined lab draw, saying she is a "difficult stick" and would prefer to do them all at the same time. - strongly recommending calling clinic first thing on Monday to make an appointment; I will ask our schedulers to assist - ordering f/u anatomy scan  Silvano Bilis 1/28/20173:08 PM

## 2015-08-10 NOTE — MAU Note (Signed)
Missed appointment for GTT.  Has no future follow up. Fingers and feet swelling

## 2015-08-10 NOTE — MAU Note (Signed)
Patient presents with having missed her last MD appointment, having swelling in her fingers, requesting lab work.

## 2015-08-13 ENCOUNTER — Telehealth: Payer: Self-pay | Admitting: General Practice

## 2015-08-13 NOTE — Telephone Encounter (Signed)
Patient called and left message on nurse line requesting a letter for [facility] to be faxed to Les at [phone]. Called patient at both listed numbers, no answer on either. Unable to leave message due to voicemail full

## 2015-08-14 ENCOUNTER — Other Ambulatory Visit: Payer: Medicaid Other

## 2015-08-14 DIAGNOSIS — O0992 Supervision of high risk pregnancy, unspecified, second trimester: Secondary | ICD-10-CM

## 2015-08-15 LAB — PRENATAL PROFILE (SOLSTAS)
ANTIBODY SCREEN: NEGATIVE
BASOS ABS: 0 10*3/uL (ref 0.0–0.1)
BASOS PCT: 0 % (ref 0–1)
EOS PCT: 1 % (ref 0–5)
Eosinophils Absolute: 0.1 10*3/uL (ref 0.0–0.7)
HEMATOCRIT: 29.5 % — AB (ref 36.0–46.0)
HIV: NONREACTIVE
Hemoglobin: 9.8 g/dL — ABNORMAL LOW (ref 12.0–15.0)
Hepatitis B Surface Ag: NEGATIVE
LYMPHS PCT: 22 % (ref 12–46)
Lymphs Abs: 1.7 10*3/uL (ref 0.7–4.0)
MCH: 29.3 pg (ref 26.0–34.0)
MCHC: 33.2 g/dL (ref 30.0–36.0)
MCV: 88.3 fL (ref 78.0–100.0)
MPV: 11.2 fL (ref 8.6–12.4)
Monocytes Absolute: 0.5 10*3/uL (ref 0.1–1.0)
Monocytes Relative: 6 % (ref 3–12)
NEUTROS ABS: 5.3 10*3/uL (ref 1.7–7.7)
Neutrophils Relative %: 71 % (ref 43–77)
Platelets: 176 10*3/uL (ref 150–400)
RBC: 3.34 MIL/uL — AB (ref 3.87–5.11)
RDW: 13.2 % (ref 11.5–15.5)
RH TYPE: POSITIVE
Rubella: 2.66 Index — ABNORMAL HIGH (ref <0.90)
WBC: 7.5 10*3/uL (ref 4.0–10.5)

## 2015-08-15 LAB — GLUCOSE TOLERANCE, 1 HOUR (50G) W/O FASTING: GLUCOSE 1 HOUR GTT: 80 mg/dL (ref 70–140)

## 2015-08-19 LAB — CYSTIC FIBROSIS DIAGNOSTIC STUDY

## 2015-08-29 ENCOUNTER — Ambulatory Visit (INDEPENDENT_AMBULATORY_CARE_PROVIDER_SITE_OTHER): Payer: Medicaid Other | Admitting: Obstetrics & Gynecology

## 2015-08-29 VITALS — BP 132/81 | HR 92 | Temp 98.2°F | Wt 162.4 lb

## 2015-08-29 DIAGNOSIS — G40909 Epilepsy, unspecified, not intractable, without status epilepticus: Secondary | ICD-10-CM

## 2015-08-29 DIAGNOSIS — O219 Vomiting of pregnancy, unspecified: Secondary | ICD-10-CM | POA: Diagnosis not present

## 2015-08-29 DIAGNOSIS — F1190 Opioid use, unspecified, uncomplicated: Secondary | ICD-10-CM

## 2015-08-29 DIAGNOSIS — O99353 Diseases of the nervous system complicating pregnancy, third trimester: Secondary | ICD-10-CM | POA: Diagnosis not present

## 2015-08-29 DIAGNOSIS — O34219 Maternal care for unspecified type scar from previous cesarean delivery: Secondary | ICD-10-CM | POA: Diagnosis not present

## 2015-08-29 DIAGNOSIS — O0993 Supervision of high risk pregnancy, unspecified, third trimester: Secondary | ICD-10-CM | POA: Diagnosis not present

## 2015-08-29 DIAGNOSIS — O99323 Drug use complicating pregnancy, third trimester: Secondary | ICD-10-CM

## 2015-08-29 LAB — POCT URINALYSIS DIP (DEVICE)
Bilirubin Urine: NEGATIVE
Glucose, UA: NEGATIVE mg/dL
HGB URINE DIPSTICK: NEGATIVE
Ketones, ur: NEGATIVE mg/dL
LEUKOCYTES UA: NEGATIVE
NITRITE: NEGATIVE
PH: 7 (ref 5.0–8.0)
PROTEIN: NEGATIVE mg/dL
Specific Gravity, Urine: 1.015 (ref 1.005–1.030)
UROBILINOGEN UA: 0.2 mg/dL (ref 0.0–1.0)

## 2015-08-29 MED ORDER — FAMOTIDINE 20 MG PO TABS: 20.0000 mg | ORAL_TABLET | Freq: Two times a day (BID) | ORAL | Status: AC

## 2015-08-29 MED ORDER — ONDANSETRON 4 MG PO TBDP: 4.0000 mg | ORAL_TABLET | Freq: Four times a day (QID) | ORAL | Status: AC | PRN

## 2015-08-29 NOTE — Patient Instructions (Signed)
Return to clinic for any obstetric concerns or go to MAU for evaluation  

## 2015-08-29 NOTE — Progress Notes (Addendum)
Subjective:  Jeanette Obrien is a 35 y.o. [phone] at [redacted]w[redacted]d being seen today for ongoing prenatal care.  She is currently monitored for the following issues for this high-risk pregnancy and has ADD (attention deficit disorder); Airway hyperreactivity; Bipolar I disorder (HCC); Polysubstance abuse; Seizure (HCC); Current tobacco use; Supervision of high risk pregnancy, antepartum; AMA (advanced maternal age) multigravida 35+; Seizure disorder in pregnancy, antepartum (HCC); Drug use affecting pregnancy, antepartum; Previous cesarean delivery, antepartum; and Group B streptococcus urinary tract infection affecting pregnancy, antepartum on her problem list.  Patient reports nausea.  Contractions: Irritability. Vag. Bleeding: None.  Movement: Present. Denies leaking of fluid.   The following portions of the patient's history were reviewed and updated as appropriate: allergies, current medications, past family history, past medical history, past social history, past surgical history and problem list. Problem list updated.  Objective:   Filed Vitals:   08/29/15 1125  BP: 132/81  Pulse: 92  Temp: 98.2 F (36.8 C)  Weight: 162 lb 6.4 oz (73.664 kg)    Fetal Status: Fetal Heart Rate (bpm): 135 Fundal Height: 32 cm Movement: Present     General:  Alert, oriented and cooperative. Patient is in no acute distress.  Skin: Skin is warm and dry. No rash noted.   Cardiovascular: Normal heart rate noted  Respiratory: Normal respiratory effort, no problems with respiration noted  Abdomen: Soft, gravid, appropriate for gestational age. Pain/Pressure: Absent     Pelvic: Vag. Bleeding: None    Cervical exam deferred        Extremities: Normal range of motion.  Edema: Trace  Mental Status: Normal mood and affect. Normal behavior. Normal judgment and thought content.   Urinalysis: Urine Protein: Negative Urine Glucose: Negative  Assessment and Plan:  Pregnancy: G3P1012 at [redacted]w[redacted]d  1. Drug use affecting  pregnancy, antepartum, third trimester On Methadone, gets from [facility].  Declines NICU NAS information tour, previous child was in NICU after birth so she is aware of what to expect.   2. Seizure disorder in pregnancy, antepartum, third trimester (HCC) On Keppra. No recent seizure activity.  3. Nausea and vomiting of pregnancy, antepartum Likely GERD related - ondansetron (ZOFRAN ODT) 4 MG disintegrating tablet; Take 1 tablet (4 mg total) by mouth every 6 (six) hours as needed for nausea.  Dispense: 20 tablet; Refill: 0 - famotidine (PEPCID) 20 MG tablet; Take 1 tablet (20 mg total) by mouth 2 (two) times daily.  Dispense: 60 tablet; Refill: 3  4. Previous cesarean delivery, antepartum Order sent in to schedule RCS at 39 weeks.  5. Supervision of high risk pregnancy, antepartum, third trimester  Preterm labor symptoms and general obstetric precautions including but not limited to vaginal bleeding, contractions, leaking of fluid and fetal movement were reviewed in detail with the patient. Please refer to After Visit Summary for other counseling recommendations.  Return in about 2 weeks (around 09/12/2015) for OB Visit.   Tereso Newcomer, MD

## 2015-08-29 NOTE — Progress Notes (Signed)
Late entry: medicaid home form completed.

## 2015-08-30 ENCOUNTER — Encounter (HOSPITAL_COMMUNITY): Payer: Self-pay | Admitting: *Deleted

## 2015-09-12 ENCOUNTER — Encounter: Payer: Self-pay | Admitting: Obstetrics & Gynecology

## 2015-09-12 ENCOUNTER — Encounter: Payer: Medicaid Other | Admitting: Obstetrics & Gynecology

## 2015-09-20 ENCOUNTER — Other Ambulatory Visit: Payer: Self-pay | Admitting: Family Medicine

## 2015-10-07 ENCOUNTER — Telehealth (HOSPITAL_COMMUNITY): Payer: Self-pay | Admitting: *Deleted

## 2015-10-07 NOTE — Telephone Encounter (Signed)
Preadmission screen  

## 2015-10-09 ENCOUNTER — Telehealth (HOSPITAL_COMMUNITY): Payer: Self-pay | Admitting: *Deleted

## 2015-10-09 NOTE — Telephone Encounter (Signed)
Preadmission screen  

## 2015-10-09 NOTE — Telephone Encounter (Signed)
Letter mailed 10/09/2015

## 2015-10-10 ENCOUNTER — Encounter (HOSPITAL_COMMUNITY): Payer: Self-pay | Admitting: *Deleted

## 2015-10-10 ENCOUNTER — Telehealth: Payer: Self-pay | Admitting: General Practice

## 2015-10-10 ENCOUNTER — Telehealth (HOSPITAL_COMMUNITY): Payer: Self-pay | Admitting: *Deleted

## 2015-10-10 NOTE — Telephone Encounter (Signed)
Preadmission screen  

## 2015-10-10 NOTE — Telephone Encounter (Signed)
Pre-admission testing called stating the patient has a scheduled c/s on Monday and they have been unable to reach her to set up appt prior to her c/s. Called only listed number and a man answered stating she wasn't home and just got out of rehab. He provided alternative number of [phone]. Called and spoke with patient discussing someone has been trying to reach her to set up an appt prior to her c/s. Discussed with patient she has to go to this appt or they will not perform her c/s on Monday. Patient verbalized understanding to all. Told patient I will contact them and provide them with this new number and someone will be calling her shortly. Patient verbalized understanding & had no questions

## 2015-10-11 ENCOUNTER — Other Ambulatory Visit (HOSPITAL_COMMUNITY): Payer: Self-pay | Admitting: Interventional Radiology

## 2015-10-11 ENCOUNTER — Encounter (HOSPITAL_COMMUNITY)
Admission: RE | Admit: 2015-10-11 | Discharge: 2015-10-11 | Disposition: A | Discharge status: Home / Self Care | Payer: Medicaid Other | Source: Ambulatory Visit | Attending: Family Medicine | Admitting: Family Medicine

## 2015-10-11 ENCOUNTER — Ambulatory Visit (HOSPITAL_COMMUNITY)
Admission: RE | Admit: 2015-10-11 | Discharge: 2015-10-11 | Disposition: A | Discharge status: Home / Self Care | Payer: Medicaid Other | Source: Ambulatory Visit | Attending: Anesthesiology | Admitting: Anesthesiology

## 2015-10-11 DIAGNOSIS — I878 Other specified disorders of veins: Secondary | ICD-10-CM | POA: Insufficient documentation

## 2015-10-11 DIAGNOSIS — O9989 Other specified diseases and conditions complicating pregnancy, childbirth and the puerperium: Secondary | ICD-10-CM | POA: Insufficient documentation

## 2015-10-11 DIAGNOSIS — Z419 Encounter for procedure for purposes other than remedying health state, unspecified: Secondary | ICD-10-CM

## 2015-10-11 DIAGNOSIS — Z3A38 38 weeks gestation of pregnancy: Secondary | ICD-10-CM | POA: Insufficient documentation

## 2015-10-11 DIAGNOSIS — Z01812 Encounter for preprocedural laboratory examination: Secondary | ICD-10-CM | POA: Insufficient documentation

## 2015-10-11 LAB — ABO/RH: ABO/RH(D): A POS

## 2015-10-11 LAB — TYPE AND SCREEN
ABO/RH(D): A POS
Antibody Screen: NEGATIVE

## 2015-10-11 LAB — CBC
HCT: 34.1 % — ABNORMAL LOW (ref 36.0–46.0)
Hemoglobin: 11.4 g/dL — ABNORMAL LOW (ref 12.0–15.0)
MCH: 29.5 pg (ref 26.0–34.0)
MCHC: 33.4 g/dL (ref 30.0–36.0)
MCV: 88.3 fL (ref 78.0–100.0)
PLATELETS: 113 10*3/uL — AB (ref 150–400)
RBC: 3.86 MIL/uL — AB (ref 3.87–5.11)
RDW: 15.2 % (ref 11.5–15.5)
WBC: 10.1 10*3/uL (ref 4.0–10.5)

## 2015-10-11 MED ORDER — HEPARIN SOD (PORK) LOCK FLUSH 100 UNIT/ML IV SOLN
INTRAVENOUS | Status: AC
  Filled 2015-10-11: qty 5

## 2015-10-11 MED ORDER — LIDOCAINE HCL 1 % IJ SOLN
INTRAMUSCULAR | Status: AC
  Filled 2015-10-11: qty 20

## 2015-10-11 NOTE — Procedures (Signed)
US/Fluoro guided placement of a PICC line due to poor venous access Catheter tip at SVC/RA junction Catheter length is 36cm PICC is ready to use  Azani Brogdon E 12:35 PM 10/11/2015

## 2015-10-12 ENCOUNTER — Other Ambulatory Visit: Payer: Self-pay | Admitting: Family Medicine

## 2015-10-12 LAB — RPR: RPR: NONREACTIVE

## 2015-10-13 NOTE — Anesthesia Preprocedure Evaluation (Addendum)
Anesthesia Evaluation  Patient identified by MRN, date of birth, ID band Patient awake    Reviewed: Allergy & Precautions, NPO status   Airway Mallampati: II  TM Distance: >3 FB Neck ROM: Full    Dental  (+) Teeth Intact, Dental Advisory Given   Pulmonary asthma , Current Smoker,    Pulmonary exam normal        Cardiovascular negative cardio ROS Normal cardiovascular exam     Neuro/Psych Seizures -,  PSYCHIATRIC DISORDERS    GI/Hepatic negative GI ROS,   Endo/Other    Renal/GU      Musculoskeletal   Abdominal   Peds  Hematology   Anesthesia Other Findings   Reproductive/Obstetrics                          Anesthesia Physical Anesthesia Plan  ASA: III  Anesthesia Plan: Spinal   Post-op Pain Management:    Induction:   Airway Management Planned: Simple Face Mask  Additional Equipment:   Intra-op Plan:   Post-operative Plan:   Informed Consent:   Plan Discussed with: Anesthesiologist  Anesthesia Plan Comments: (SAB without intrathecal narcotics )       Anesthesia Quick Evaluation

## 2015-10-14 ENCOUNTER — Encounter (HOSPITAL_COMMUNITY): Payer: Self-pay | Admitting: *Deleted

## 2015-10-14 ENCOUNTER — Encounter (HOSPITAL_COMMUNITY)
Admission: RE | Disposition: A | Discharge status: Home / Self Care | Payer: Self-pay | Source: Ambulatory Visit | Attending: Family Medicine

## 2015-10-14 ENCOUNTER — Inpatient Hospital Stay (HOSPITAL_COMMUNITY): Payer: Medicaid Other | Admitting: Anesthesiology

## 2015-10-14 ENCOUNTER — Inpatient Hospital Stay (HOSPITAL_COMMUNITY)
Admission: RE | Admit: 2015-10-14 | Discharge: 2015-10-17 | DRG: 765 | Disposition: A | Discharge status: Home / Self Care | Payer: Medicaid Other | Source: Ambulatory Visit | Attending: Family Medicine | Admitting: Family Medicine

## 2015-10-14 DIAGNOSIS — B951 Streptococcus, group B, as the cause of diseases classified elsewhere: Secondary | ICD-10-CM | POA: Diagnosis present

## 2015-10-14 DIAGNOSIS — Z833 Family history of diabetes mellitus: Secondary | ICD-10-CM | POA: Diagnosis not present

## 2015-10-14 DIAGNOSIS — Z23 Encounter for immunization: Secondary | ICD-10-CM

## 2015-10-14 DIAGNOSIS — Z8249 Family history of ischemic heart disease and other diseases of the circulatory system: Secondary | ICD-10-CM

## 2015-10-14 DIAGNOSIS — F319 Bipolar disorder, unspecified: Secondary | ICD-10-CM | POA: Diagnosis present

## 2015-10-14 DIAGNOSIS — O0933 Supervision of pregnancy with insufficient antenatal care, third trimester: Secondary | ICD-10-CM

## 2015-10-14 DIAGNOSIS — O99334 Smoking (tobacco) complicating childbirth: Secondary | ICD-10-CM | POA: Diagnosis present

## 2015-10-14 DIAGNOSIS — F1190 Opioid use, unspecified, uncomplicated: Secondary | ICD-10-CM

## 2015-10-14 DIAGNOSIS — O99320 Drug use complicating pregnancy, unspecified trimester: Secondary | ICD-10-CM | POA: Diagnosis present

## 2015-10-14 DIAGNOSIS — O34211 Maternal care for low transverse scar from previous cesarean delivery: Secondary | ICD-10-CM | POA: Diagnosis present

## 2015-10-14 DIAGNOSIS — Z98891 History of uterine scar from previous surgery: Secondary | ICD-10-CM

## 2015-10-14 DIAGNOSIS — O2340 Unspecified infection of urinary tract in pregnancy, unspecified trimester: Secondary | ICD-10-CM

## 2015-10-14 DIAGNOSIS — O09522 Supervision of elderly multigravida, second trimester: Secondary | ICD-10-CM

## 2015-10-14 DIAGNOSIS — O34219 Maternal care for unspecified type scar from previous cesarean delivery: Secondary | ICD-10-CM

## 2015-10-14 DIAGNOSIS — O09529 Supervision of elderly multigravida, unspecified trimester: Secondary | ICD-10-CM

## 2015-10-14 DIAGNOSIS — Z3A39 39 weeks gestation of pregnancy: Secondary | ICD-10-CM

## 2015-10-14 DIAGNOSIS — O99354 Diseases of the nervous system complicating childbirth: Secondary | ICD-10-CM | POA: Diagnosis present

## 2015-10-14 DIAGNOSIS — G40909 Epilepsy, unspecified, not intractable, without status epilepticus: Secondary | ICD-10-CM | POA: Diagnosis present

## 2015-10-14 DIAGNOSIS — O99824 Streptococcus B carrier state complicating childbirth: Secondary | ICD-10-CM | POA: Diagnosis present

## 2015-10-14 DIAGNOSIS — F1910 Other psychoactive substance abuse, uncomplicated: Secondary | ICD-10-CM | POA: Diagnosis present

## 2015-10-14 DIAGNOSIS — Z8759 Personal history of other complications of pregnancy, childbirth and the puerperium: Secondary | ICD-10-CM

## 2015-10-14 DIAGNOSIS — Z825 Family history of asthma and other chronic lower respiratory diseases: Secondary | ICD-10-CM | POA: Diagnosis not present

## 2015-10-14 DIAGNOSIS — F17210 Nicotine dependence, cigarettes, uncomplicated: Secondary | ICD-10-CM | POA: Diagnosis present

## 2015-10-14 DIAGNOSIS — Z720 Tobacco use: Secondary | ICD-10-CM | POA: Diagnosis present

## 2015-10-14 DIAGNOSIS — O99324 Drug use complicating childbirth: Secondary | ICD-10-CM | POA: Diagnosis not present

## 2015-10-14 DIAGNOSIS — O99350 Diseases of the nervous system complicating pregnancy, unspecified trimester: Secondary | ICD-10-CM

## 2015-10-14 DIAGNOSIS — K660 Peritoneal adhesions (postprocedural) (postinfection): Secondary | ICD-10-CM | POA: Diagnosis present

## 2015-10-14 DIAGNOSIS — F1120 Opioid dependence, uncomplicated: Secondary | ICD-10-CM | POA: Diagnosis present

## 2015-10-14 DIAGNOSIS — R569 Unspecified convulsions: Secondary | ICD-10-CM

## 2015-10-14 DIAGNOSIS — O0990 Supervision of high risk pregnancy, unspecified, unspecified trimester: Secondary | ICD-10-CM

## 2015-10-14 LAB — CBC
HCT: 30.9 % — ABNORMAL LOW (ref 36.0–46.0)
HEMOGLOBIN: 10 g/dL — AB (ref 12.0–15.0)
MCH: 28.8 pg (ref 26.0–34.0)
MCHC: 32.4 g/dL (ref 30.0–36.0)
MCV: 89 fL (ref 78.0–100.0)
PLATELETS: 114 10*3/uL — AB (ref 150–400)
RBC: 3.47 MIL/uL — AB (ref 3.87–5.11)
RDW: 15.2 % (ref 11.5–15.5)
WBC: 11 10*3/uL — AB (ref 4.0–10.5)

## 2015-10-14 LAB — MRSA PCR SCREENING: MRSA BY PCR: POSITIVE — AB

## 2015-10-14 LAB — RAPID HIV SCREEN (HIV 1/2 AB+AG)
HIV 1/2 ANTIBODIES: NONREACTIVE
HIV-1 P24 ANTIGEN - HIV24: NONREACTIVE

## 2015-10-14 SURGERY — Surgical Case
Anesthesia: Spinal | Site: Abdomen

## 2015-10-14 MED ORDER — SCOPOLAMINE 1 MG/3DAYS TD PT72
MEDICATED_PATCH | TRANSDERMAL | Status: AC
  Administered 2015-10-14: 1.5 mg via TRANSDERMAL
  Filled 2015-10-14: qty 1

## 2015-10-14 MED ORDER — SODIUM CHLORIDE 0.9% FLUSH: 9.0000 mL | INTRAVENOUS | Status: DC | PRN

## 2015-10-14 MED ORDER — METHADONE HCL 10 MG/ML PO CONC
99.0000 mg | Freq: Every day | ORAL | Status: DC
  Administered 2015-10-14 – 2015-10-17 (×4): 99 mg via ORAL
  Filled 2015-10-14 (×5): qty 9.9

## 2015-10-14 MED ORDER — SIMETHICONE 80 MG PO CHEW
80.0000 mg | CHEWABLE_TABLET | Freq: Three times a day (TID) | ORAL | Status: DC
  Administered 2015-10-14 – 2015-10-17 (×9): 80 mg via ORAL
  Filled 2015-10-14 (×10): qty 1

## 2015-10-14 MED ORDER — MORPHINE SULFATE (PF) 0.5 MG/ML IJ SOLN
INTRAMUSCULAR | Status: AC
  Filled 2015-10-14: qty 10

## 2015-10-14 MED ORDER — PHENYLEPHRINE 40 MCG/ML (10ML) SYRINGE FOR IV PUSH (FOR BLOOD PRESSURE SUPPORT)
PREFILLED_SYRINGE | INTRAVENOUS | Status: AC
  Filled 2015-10-14: qty 10

## 2015-10-14 MED ORDER — CEFAZOLIN SODIUM-DEXTROSE 2-4 GM/100ML-% IV SOLN
2.0000 g | INTRAVENOUS | Status: AC
  Administered 2015-10-14: 2 g via INTRAVENOUS

## 2015-10-14 MED ORDER — NALOXONE HCL 0.4 MG/ML IJ SOLN: 0.4000 mg | INTRAMUSCULAR | Status: DC | PRN

## 2015-10-14 MED ORDER — SODIUM CHLORIDE 0.9% FLUSH
10.0000 mL | INTRAVENOUS | Status: DC | PRN
  Administered 2015-10-14: 10 mL via INTRAVENOUS
  Filled 2015-10-14 (×4): qty 10

## 2015-10-14 MED ORDER — DIBUCAINE 1 % RE OINT: 1.0000 "application " | TOPICAL_OINTMENT | RECTAL | Status: DC | PRN

## 2015-10-14 MED ORDER — NICOTINE 7 MG/24HR TD PT24
7.0000 mg | MEDICATED_PATCH | Freq: Every day | TRANSDERMAL | Status: DC
  Administered 2015-10-14 – 2015-10-17 (×4): 7 mg via TRANSDERMAL
  Filled 2015-10-14 (×5): qty 1

## 2015-10-14 MED ORDER — WITCH HAZEL-GLYCERIN EX PADS: 1.0000 "application " | MEDICATED_PAD | CUTANEOUS | Status: DC | PRN

## 2015-10-14 MED ORDER — HYDROMORPHONE 1 MG/ML IV SOLN
INTRAVENOUS | Status: DC
  Administered 2015-10-14: 12:00:00 via INTRAVENOUS
  Administered 2015-10-14: 1.5 mg via INTRAVENOUS
  Administered 2015-10-14: 4.2 mg via INTRAVENOUS
  Administered 2015-10-15: 0.9 mg via INTRAVENOUS
  Filled 2015-10-14: qty 25

## 2015-10-14 MED ORDER — HYDROMORPHONE HCL 1 MG/ML IJ SOLN
0.5000 mg | INTRAMUSCULAR | Status: AC | PRN
  Administered 2015-10-14 (×4): 0.5 mg via INTRAVENOUS
  Filled 2015-10-14 (×2): qty 1

## 2015-10-14 MED ORDER — SODIUM CHLORIDE 0.9 % IR SOLN
Status: DC | PRN
  Administered 2015-10-14: 1000 mL

## 2015-10-14 MED ORDER — MENTHOL 3 MG MT LOZG: 1.0000 | LOZENGE | OROMUCOSAL | Status: DC | PRN

## 2015-10-14 MED ORDER — GABAPENTIN 300 MG PO CAPS
600.0000 mg | ORAL_CAPSULE | Freq: Three times a day (TID) | ORAL | Status: DC
  Administered 2015-10-14 – 2015-10-17 (×10): 600 mg via ORAL
  Filled 2015-10-14 (×13): qty 2

## 2015-10-14 MED ORDER — IBUPROFEN 600 MG PO TABS
600.0000 mg | ORAL_TABLET | Freq: Four times a day (QID) | ORAL | Status: DC
  Administered 2015-10-14 – 2015-10-15 (×4): 600 mg via ORAL
  Filled 2015-10-14 (×4): qty 1

## 2015-10-14 MED ORDER — CEFAZOLIN SODIUM-DEXTROSE 2-3 GM-% IV SOLR
INTRAVENOUS | Status: AC
  Filled 2015-10-14: qty 50

## 2015-10-14 MED ORDER — DIPHENHYDRAMINE HCL 25 MG PO CAPS: 25.0000 mg | ORAL_CAPSULE | Freq: Four times a day (QID) | ORAL | Status: DC | PRN

## 2015-10-14 MED ORDER — DIPHENHYDRAMINE HCL 50 MG/ML IJ SOLN: 12.5000 mg | Freq: Four times a day (QID) | INTRAMUSCULAR | Status: DC | PRN

## 2015-10-14 MED ORDER — GABAPENTIN 600 MG PO TABS
600.0000 mg | ORAL_TABLET | Freq: Three times a day (TID) | ORAL | Status: DC
  Filled 2015-10-14: qty 1

## 2015-10-14 MED ORDER — OXYTOCIN 10 UNIT/ML IJ SOLN: 2.5000 [IU]/h | INTRAVENOUS | Status: AC

## 2015-10-14 MED ORDER — ACETAMINOPHEN 325 MG PO TABS
650.0000 mg | ORAL_TABLET | ORAL | Status: DC | PRN
  Administered 2015-10-15 – 2015-10-17 (×2): 650 mg via ORAL
  Filled 2015-10-14 (×2): qty 2

## 2015-10-14 MED ORDER — SENNOSIDES-DOCUSATE SODIUM 8.6-50 MG PO TABS
2.0000 | ORAL_TABLET | ORAL | Status: DC
  Administered 2015-10-14 – 2015-10-16 (×3): 2 via ORAL
  Filled 2015-10-14 (×3): qty 2

## 2015-10-14 MED ORDER — ONDANSETRON HCL 4 MG/2ML IJ SOLN
INTRAMUSCULAR | Status: DC | PRN
  Administered 2015-10-14: 4 mg via INTRAVENOUS

## 2015-10-14 MED ORDER — CHLORHEXIDINE GLUCONATE CLOTH 2 % EX PADS
6.0000 | MEDICATED_PAD | Freq: Every day | CUTANEOUS | Status: DC
  Administered 2015-10-15 – 2015-10-17 (×3): 6 via TOPICAL

## 2015-10-14 MED ORDER — LACTATED RINGERS IV SOLN
INTRAVENOUS | Status: DC
  Administered 2015-10-14 (×2): via INTRAVENOUS

## 2015-10-14 MED ORDER — SERTRALINE HCL 100 MG PO TABS
100.0000 mg | ORAL_TABLET | Freq: Every day | ORAL | Status: DC
  Administered 2015-10-14 – 2015-10-17 (×4): 100 mg via ORAL
  Filled 2015-10-14 (×5): qty 1

## 2015-10-14 MED ORDER — DIPHENHYDRAMINE HCL 12.5 MG/5ML PO ELIX: 12.5000 mg | ORAL_SOLUTION | Freq: Four times a day (QID) | ORAL | Status: DC | PRN

## 2015-10-14 MED ORDER — MUPIROCIN CALCIUM 2 % EX CREA
TOPICAL_CREAM | Freq: Two times a day (BID) | CUTANEOUS | Status: DC
  Administered 2015-10-14 – 2015-10-15 (×3): via TOPICAL
  Administered 2015-10-16: 1 via TOPICAL
  Administered 2015-10-16 – 2015-10-17 (×2): via TOPICAL
  Filled 2015-10-14: qty 15

## 2015-10-14 MED ORDER — LACTATED RINGERS IV SOLN: 125.0000 mL/h | INTRAVENOUS | Status: DC

## 2015-10-14 MED ORDER — MUPIROCIN 2 % EX OINT
1.0000 "application " | TOPICAL_OINTMENT | Freq: Two times a day (BID) | CUTANEOUS | Status: DC
  Filled 2015-10-14: qty 22

## 2015-10-14 MED ORDER — ONDANSETRON HCL 4 MG/2ML IJ SOLN: 4.0000 mg | Freq: Four times a day (QID) | INTRAMUSCULAR | Status: DC | PRN

## 2015-10-14 MED ORDER — PHENYLEPHRINE HCL 10 MG/ML IJ SOLN
INTRAMUSCULAR | Status: DC | PRN
  Administered 2015-10-14: 40 ug via INTRAVENOUS

## 2015-10-14 MED ORDER — PNEUMOCOCCAL VAC POLYVALENT 25 MCG/0.5ML IJ INJ
0.5000 mL | INJECTION | INTRAMUSCULAR | Status: AC
  Administered 2015-10-16: 0.5 mL via INTRAMUSCULAR
  Filled 2015-10-14: qty 0.5

## 2015-10-14 MED ORDER — METHADONE HCL 10 MG/ML PO CONC: 99.0000 mg | Freq: Every day | ORAL | Status: DC

## 2015-10-14 MED ORDER — HYDROMORPHONE HCL 1 MG/ML IJ SOLN
INTRAMUSCULAR | Status: AC
  Administered 2015-10-14: 0.5 mg via INTRAVENOUS
  Filled 2015-10-14: qty 1

## 2015-10-14 MED ORDER — ALBUTEROL SULFATE (2.5 MG/3ML) 0.083% IN NEBU: 3.0000 mL | INHALATION_SOLUTION | Freq: Four times a day (QID) | RESPIRATORY_TRACT | Status: DC | PRN

## 2015-10-14 MED ORDER — BUPIVACAINE HCL (PF) 0.75 % IJ SOLN
INTRAMUSCULAR | Status: DC | PRN
  Administered 2015-10-14: 12 mg via INTRATHECAL

## 2015-10-14 MED ORDER — OXYCODONE HCL 5 MG PO TABS
5.0000 mg | ORAL_TABLET | ORAL | Status: DC | PRN
  Administered 2015-10-15 – 2015-10-16 (×2): 5 mg via ORAL
  Filled 2015-10-14 (×2): qty 1

## 2015-10-14 MED ORDER — PRENATAL MULTIVITAMIN CH
1.0000 | ORAL_TABLET | Freq: Every day | ORAL | Status: DC
  Administered 2015-10-14 – 2015-10-17 (×4): 1 via ORAL
  Filled 2015-10-14 (×4): qty 1

## 2015-10-14 MED ORDER — ONDANSETRON HCL 4 MG/2ML IJ SOLN
INTRAMUSCULAR | Status: AC
  Filled 2015-10-14: qty 2

## 2015-10-14 MED ORDER — TETANUS-DIPHTH-ACELL PERTUSSIS 5-2.5-18.5 LF-MCG/0.5 IM SUSP: 0.5000 mL | Freq: Once | INTRAMUSCULAR | Status: DC

## 2015-10-14 MED ORDER — LACTATED RINGERS IV SOLN
Freq: Once | INTRAVENOUS | Status: AC
  Administered 2015-10-14 (×2): via INTRAVENOUS

## 2015-10-14 MED ORDER — OXYCODONE HCL 5 MG PO TABS
10.0000 mg | ORAL_TABLET | ORAL | Status: DC | PRN
  Administered 2015-10-14 – 2015-10-17 (×11): 10 mg via ORAL
  Filled 2015-10-14 (×11): qty 2

## 2015-10-14 MED ORDER — PHENYLEPHRINE 8 MG IN D5W 100 ML (0.08MG/ML) PREMIX OPTIME
INJECTION | INTRAVENOUS | Status: AC
  Filled 2015-10-14: qty 100

## 2015-10-14 MED ORDER — SIMETHICONE 80 MG PO CHEW
80.0000 mg | CHEWABLE_TABLET | ORAL | Status: DC
  Administered 2015-10-14 – 2015-10-16 (×3): 80 mg via ORAL
  Filled 2015-10-14 (×3): qty 1

## 2015-10-14 MED ORDER — SODIUM CHLORIDE 0.9% FLUSH
INTRAVENOUS | Status: AC
  Filled 2015-10-14: qty 12

## 2015-10-14 MED ORDER — FENTANYL CITRATE (PF) 100 MCG/2ML IJ SOLN
INTRAMUSCULAR | Status: AC
  Filled 2015-10-14: qty 2

## 2015-10-14 MED ORDER — MOMETASONE FURO-FORMOTEROL FUM 200-5 MCG/ACT IN AERO
2.0000 | INHALATION_SPRAY | Freq: Two times a day (BID) | RESPIRATORY_TRACT | Status: DC
  Administered 2015-10-14 – 2015-10-17 (×5): 2 via RESPIRATORY_TRACT
  Filled 2015-10-14: qty 8.8

## 2015-10-14 MED ORDER — SCOPOLAMINE 1 MG/3DAYS TD PT72
1.0000 | MEDICATED_PATCH | Freq: Once | TRANSDERMAL | Status: DC
  Administered 2015-10-14: 1.5 mg via TRANSDERMAL

## 2015-10-14 MED ORDER — SIMETHICONE 80 MG PO CHEW
80.0000 mg | CHEWABLE_TABLET | ORAL | Status: DC | PRN
  Administered 2015-10-14: 80 mg via ORAL

## 2015-10-14 MED ORDER — OXYTOCIN 10 UNIT/ML IJ SOLN
40.0000 [IU] | INTRAVENOUS | Status: DC | PRN
  Administered 2015-10-14: 40 [IU] via INTRAVENOUS

## 2015-10-14 MED ORDER — OXYTOCIN 10 UNIT/ML IJ SOLN
INTRAMUSCULAR | Status: AC
  Filled 2015-10-14: qty 4

## 2015-10-14 MED ORDER — ZOLPIDEM TARTRATE 5 MG PO TABS
5.0000 mg | ORAL_TABLET | Freq: Every evening | ORAL | Status: DC | PRN
  Administered 2015-10-16: 5 mg via ORAL
  Filled 2015-10-14: qty 1

## 2015-10-14 MED ORDER — BUPIVACAINE IN DEXTROSE 0.75-8.25 % IT SOLN
INTRATHECAL | Status: DC | PRN
  Administered 2015-10-14: 1.6 mL via INTRATHECAL

## 2015-10-14 MED ORDER — LACTATED RINGERS IV SOLN
INTRAVENOUS | Status: DC
  Administered 2015-10-14: 08:00:00 via INTRAVENOUS

## 2015-10-14 MED ORDER — LEVETIRACETAM 500 MG PO TABS
500.0000 mg | ORAL_TABLET | Freq: Three times a day (TID) | ORAL | Status: DC
  Administered 2015-10-14 – 2015-10-17 (×10): 500 mg via ORAL
  Filled 2015-10-14 (×13): qty 1

## 2015-10-14 MED ORDER — LANOLIN HYDROUS EX OINT: 1.0000 "application " | TOPICAL_OINTMENT | CUTANEOUS | Status: DC | PRN

## 2015-10-14 MED ORDER — PHENYLEPHRINE 8 MG IN D5W 100 ML (0.08MG/ML) PREMIX OPTIME
INJECTION | INTRAVENOUS | Status: DC | PRN
  Administered 2015-10-14: 60 ug/min via INTRAVENOUS

## 2015-10-14 MED ORDER — SCOPOLAMINE 1 MG/3DAYS TD PT72: 1.0000 | MEDICATED_PATCH | Freq: Once | TRANSDERMAL | Status: DC

## 2015-10-14 SURGICAL SUPPLY — 34 items
BENZOIN TINCTURE PRP APPL 2/3 (GAUZE/BANDAGES/DRESSINGS) ×3 IMPLANT
CATH ROBINSON RED A/P 16FR (CATHETERS) IMPLANT
CHLORAPREP W/TINT 26ML (MISCELLANEOUS) ×3 IMPLANT
CLAMP CORD UMBIL (MISCELLANEOUS) IMPLANT
CLOSURE WOUND 1/2 X4 (GAUZE/BANDAGES/DRESSINGS) ×1
CLOTH BEACON ORANGE TIMEOUT ST (SAFETY) ×3 IMPLANT
DRSG OPSITE POSTOP 4X10 (GAUZE/BANDAGES/DRESSINGS) ×3 IMPLANT
ELECT REM PT RETURN 9FT ADLT (ELECTROSURGICAL) ×3
ELECTRODE REM PT RTRN 9FT ADLT (ELECTROSURGICAL) ×1 IMPLANT
EXTRACTOR VACUUM M CUP 4 TUBE (SUCTIONS) IMPLANT
EXTRACTOR VACUUM M CUP 4' TUBE (SUCTIONS)
GLOVE BIOGEL PI IND STRL 7.0 (GLOVE) ×2 IMPLANT
GLOVE BIOGEL PI IND STRL 7.5 (GLOVE) ×2 IMPLANT
GLOVE BIOGEL PI INDICATOR 7.0 (GLOVE) ×4
GLOVE BIOGEL PI INDICATOR 7.5 (GLOVE) ×4
GLOVE ECLIPSE 7.5 STRL STRAW (GLOVE) ×3 IMPLANT
GOWN STRL REUS W/TWL LRG LVL3 (GOWN DISPOSABLE) ×9 IMPLANT
KIT ABG SYR 3ML LUER SLIP (SYRINGE) IMPLANT
NEEDLE HYPO 25X5/8 SAFETYGLIDE (NEEDLE) IMPLANT
NS IRRIG 1000ML POUR BTL (IV SOLUTION) ×3 IMPLANT
PACK C SECTION WH (CUSTOM PROCEDURE TRAY) ×3 IMPLANT
PAD ABD 7.5X8 STRL (GAUZE/BANDAGES/DRESSINGS) ×3 IMPLANT
PAD OB MATERNITY 4.3X12.25 (PERSONAL CARE ITEMS) ×3 IMPLANT
PENCIL SMOKE EVAC W/HOLSTER (ELECTROSURGICAL) ×3 IMPLANT
RTRCTR C-SECT PINK 25CM LRG (MISCELLANEOUS) ×3 IMPLANT
SPONGE GAUZE 4X4 12PLY STER LF (GAUZE/BANDAGES/DRESSINGS) ×6 IMPLANT
STRIP CLOSURE SKIN 1/2X4 (GAUZE/BANDAGES/DRESSINGS) ×2 IMPLANT
SUT VIC AB 0 CTX 36 (SUTURE) ×6
SUT VIC AB 0 CTX36XBRD ANBCTRL (SUTURE) ×3 IMPLANT
SUT VIC AB 2-0 CT1 27 (SUTURE) ×2
SUT VIC AB 2-0 CT1 TAPERPNT 27 (SUTURE) ×1 IMPLANT
SUT VIC AB 4-0 KS 27 (SUTURE) ×3 IMPLANT
TOWEL OR 17X24 6PK STRL BLUE (TOWEL DISPOSABLE) ×3 IMPLANT
TRAY FOLEY CATH SILVER 14FR (SET/KITS/TRAYS/PACK) ×3 IMPLANT

## 2015-10-14 NOTE — Transfer of Care (Signed)
Immediate Anesthesia Transfer of Care Note  Patient: Jeanette Obrien  Procedure(s) Performed: Procedure(s): CESAREAN SECTION (N/A)  Patient Location: PACU  Anesthesia Type:Spinal  Level of Consciousness: awake, alert  and oriented  Airway & Oxygen Therapy: Patient Spontanous Breathing  Post-op Assessment: Report given to RN and Post -op Vital signs reviewed and stable  Post vital signs: Reviewed and stable  Last Vitals:  Filed Vitals:   10/14/15 0644  BP: 128/93  Pulse: 89  Temp: 36.5 C  Resp: 18    Complications: No apparent anesthesia complications

## 2015-10-14 NOTE — Consult Note (Signed)
[facility]  Delivery Note:  C-section       10/14/2015  7:50 AM  I was called to the operating room at the request of the patient's obstetrician (Dr. Adrian BlackwaterStinson) for a repeat c-section.  PRENATAL HX:  This is a 35 y/o G3P1011 at 5239 weeks gestation who was admitted for a repeat c-section.  Pregnancy was been complicated by methadone use with history of heroin use, GBS bacteriuria, and insufficient prenatal care (onset at 26 weeks).  Repeat c-section with AROM at delivery  DELIVERY:  Infant was vigorous at delivery, initially requiring no resuscitation other than standard warming, drying and stimulation.  APGARs 8 and 8.  Infant still had central cyanosis at 5 minutes so pulse oximeter was applied.  O2 saturations in the 70s so blow by O2 (max FiO2 50% applied).  By 15 minutes, O2 saturations remained in upper 80s to low 90s.  Exam notable for tachypnea with occasional grunting, otherwise within normal limits.  Copious oral secretions also noted.  Expect there is some degree of delayed transition.  After 20 minutes, baby left with nurse to assist parents with skin-to-skin care.  However, O2 saturations dropped to 70s during skin to skin and in low 80's on warmer.  Will admit to NICU for further evaluation.    _____________________ Electronically Signed By: [name], MD Neonatologist

## 2015-10-14 NOTE — Op Note (Signed)
Cesarean Section Operative Report  Jeanette Obrien  10/14/2015  Indications: Scheduled Proceedure/Maternal Request   Pre-operative Diagnosis: elective repeat.   Post-operative Diagnosis: Same   Surgeon: Surgeon(s) and Role:    * Levie HeritageJacob J Stinson, DO - Primary     * Shonna ChockNoah Delecia Vastine, MD - assisting  Attending Attestation: I was present and scrubbed for the entire procedure.   Anesthesia: spinal    Estimated Blood Loss: 600 ml  Total IV Fluids: 2000 ml LR  Urine Output:: 150 ml clear yellow urine  Specimens: none  Findings: Viable female infant in cephalic presentation; Apgars 8/8; weight 2960 g; arterial cord pH not obtained; terminal meconium; intact placenta with three vessel cord; normal uterus, fallopian tubes and ovaries bilaterally. Minimal adhesive disease.  Baby condition / location:  NICU (respiratory status)  Complications: no complications  Indications: Jeanette Obrien is a 35 y.o. G3P1011 with an IUP 2388w0d presenting for elective scheduled repeat c/s. Hx c/s x1.  The risks, benefits, complications, treatment options, and expected outcomes were discussed with the patient . The patient concurred with the proposed plan, giving informed consent. identified as Jeanette Obrien and the procedure verified as C-Section Delivery.  Procedure Details:  The patient was taken back to the operative suite where spinal anesthesia was placed.  A time out was held and the above information confirmed.   After induction of anesthesia, the patient was draped and prepped in the usual sterile manner and placed in a dorsal supine position with a leftward tilt. A Pfannenstiel incision was made and carried down through the subcutaneous tissue to the fascia. Fascial incision was made and sharply extended transversely. The fascia was separated from the underlying rectus tissue superiorly and inferiorly. The peritoneum was identified and bluntly entered and extended longitudinally. Alexis retractor  was placed. Bladder flap created. A low transverse uterine incision was made and extended bluntly. Delivered from cephalic presentation was a viable infant with Apgars and weight as above. The umbilical cord was clamped and cut cord blood was obtained for evaluation. Cord ph was not sent. The placenta was removed Intact and appeared normal. The uterine outline, tubes and ovaries appeared normal. One small adhesion of omentum to uterine body identified and suture ligated with 0 vicryl. The uterine incision was closed with running locked sutures of 0Vicryl with an imbricating layer of the same.   Hemostasis was observed. The peritoneum was closed with 0 vicryl. The rectus muscles were examined and hemostasis observed. The fascia was then reapproximated with running sutures of 0Vicryl. The skin was closed with 4-0Vicryl.   Instrument, sponge, and needle counts were correct prior the abdominal closure and were correct at the conclusion of the case.     Disposition: PACU - hemodynamically stable.   Maternal Condition: stable       Signed: Lavonne Chickoah B WoukMD 10/14/2015 8:54 AM

## 2015-10-14 NOTE — H&P (Signed)
Faculty Practice H&P  Jeanette Obrien is a 35 y.o. female G3P1011 with IUP at [redacted]w[redacted]d presenting for repeat cesarean section. Pregnancy was been complicated by methadone use, GBS bacteriuria, previous cesarean section, insufficient prenatal care.   Pt states she has been having no contractions, no vaginal bleeding, intact membranes, with normal fetal movement.     Prenatal Course Source of Care: [facility]  with onset of care at 26 weeks Pregnancy complications or risks: Patient Active Problem List   Diagnosis Date Noted  . Group B streptococcus urinary tract infection affecting pregnancy, antepartum 07/18/2015  . Supervision of high risk pregnancy, antepartum 07/15/2015  . AMA (advanced maternal age) multigravida 35+ 07/15/2015  . Seizure disorder in pregnancy, antepartum (HCC) 07/15/2015  . Drug use affecting pregnancy, antepartum 07/15/2015  . Previous cesarean delivery, antepartum 07/15/2015  . ADD (attention deficit disorder) 09/17/2014  . Bipolar I disorder (HCC) 09/17/2014  . Seizure (HCC) 09/17/2014  . Current tobacco use 09/17/2014  . Airway hyperreactivity 07/15/2012  . Polysubstance abuse 07/15/2012   She desires to bilateral tubal ligation.  She plans to plans to bottle feed.  Prenatal labs and studies: ABO, Rh: --/--/A POS, A POS (03/31 1045) Antibody: NEG (03/31 1045) Rubella: !Error! RPR: Non Reactive (03/31 1045)  HBsAg: NEGATIVE (02/01 1238)  HIV: NONREACTIVE (02/01 1238)  GBS: Positive (01/02 0000)  1 hr Glucola 80 Genetic screening: late to care Anatomy US normal  Past Medical History:  Past Medical History  Diagnosis Date  . Seizures (HCC)   . Asthma   . Substance abuse     takes methadone  . Heart murmur     Past Surgical History:  Past Surgical History  Procedure Laterality Date  . Cesarean section    . Finger surgery    . Cystectomy      Obstetrical History:  OB History    Gravida Para Term Preterm AB TAB SAB Ectopic Multiple Living   0 1 1 0 0 0 1      Gynecological History:  OB History    Gravida Para Term Preterm AB TAB SAB Ectopic Multiple Living   0 1 1 0 0 0 1      Social History:  Social History   Social History  . Marital Status: Single    Spouse Name: N/A  . Number of Children: N/A  . Years of Education: N/A   Social History Main Topics  . Smoking status: Current Every Day Smoker -- 0.50 packs/day    Types: Cigarettes  . Smokeless tobacco: Never Used  . Alcohol Use: No  . Drug Use: No     Comment: pills and heroin, taking methadone  . Sexual Activity: Yes   Other Topics Concern  . None   Social History Narrative    Family History:  Family History  Problem Relation Age of Onset  . Heart disease Mother   . Seizures Mother   . Asthma Mother   . COPD Mother   . Diabetes Maternal Aunt     Medications:  Prenatal vitamins,  Current Facility-Administered Medications  Medication Dose Route Frequency Provider Last Rate Last Dose  . ceFAZolin (ANCEF) 2-3 GM-% IVPB SOLR           . ceFAZolin (ANCEF) IVPB 2g/100 mL premix  2 g Intravenous On Call to OR Levie Heritage, DO      . lactated ringers infusion   Intravenous Continuous Heather Roberts, MD      .  scopolamine (TRANSDERM-SCOP) 1 MG/3DAYS 1.5 mg  1 patch Transdermal Once Heather RobertsJames Singer, MD   1.5 mg at 10/14/15 0630  . sodium chloride flush (NS) 0.9 % injection 10 mL  10 mL Intravenous PRN Rhona RaiderJacob J Stinson, DO   10 mL at 10/14/15 0703    Allergies:  Allergies  Allergen Reactions  . Morphine And Related Hives  . Sulfa Antibiotics Swelling and Other (See Comments)    blisters  . Toradol [Ketorolac Tromethamine] Other (See Comments)    Stomach upset    Review of Systems: - negative  Physical Exam: Blood pressure 128/93, pulse 89, temperature 97.7 F (36.5 C), temperature source Oral, resp. rate 18, SpO2 100 %. GENERAL: Well-developed, well-nourished female in no acute distress.  LUNGS: Clear to auscultation bilaterally.  HEART:  Regular rate and rhythm. ABDOMEN: Soft, nontender, nondistended, gravid. EFW 7.5-8 lbs EXTREMITIES: Nontender, no edema, 2+ distal pulses. Presentation: cephalic FHT:  Baseline rate 140 bpm    Pertinent Labs/Studies:    Lab Results  Component Value Date   WBC 11.0* 10/14/2015   HGB 10.0* 10/14/2015   HCT 30.9* 10/14/2015   MCV 89.0 10/14/2015   PLT PENDING 10/14/2015     Assessment : Jeanette Obrien is a 35 y.o. G3P1011 at 5168w0d being admitted for cesarean section secondary to elective repeat  Plan: The risks of cesarean section discussed with the patient included but were not limited to: bleeding which may require transfusion or reoperation; infection which may require antibiotics; injury to bowel, bladder, ureters or other surrounding organs; injury to the fetus; need for additional procedures including hysterectomy in the event of a life-threatening hemorrhage; placental abnormalities wth subsequent pregnancies, incisional problems, thromboembolic phenomenon and other postoperative/anesthesia complications. The patient concurred with the proposed plan, giving informed written consent for the procedure.   Patient has been NPO since last night and will remain NPO for procedure.  Preoperative prophylactic Ancef ordered on call to the OR.    Levie HeritageJacob J Stinson, DO 10/14/2015, 7:08 AM

## 2015-10-14 NOTE — Progress Notes (Signed)
Notified Dr. Adrian BlackwaterStinson that pt's MRSA swab was positive and that protocol was initiated, no new orders needed. Sheryn BisonGordon, Emmilia Sowder Warner

## 2015-10-14 NOTE — Anesthesia Postprocedure Evaluation (Signed)
Anesthesia Post Note  Patient: Clyde Lundborgasha D Labriola  Procedure(s) Performed: Procedure(s) (LRB): CESAREAN SECTION (N/A)  Patient location during evaluation: PACU Anesthesia Type: Spinal Level of consciousness: oriented and awake and alert Pain management: pain level controlled Vital Signs Assessment: post-procedure vital signs reviewed and stable Respiratory status: spontaneous breathing, respiratory function stable and patient connected to nasal cannula oxygen Cardiovascular status: blood pressure returned to baseline and stable Postop Assessment: no headache and no backache Anesthetic complications: no    Last Vitals:  Filed Vitals:   10/14/15 0953 10/14/15 1009  BP:  122/86  Pulse: 63 63  Temp:  36.9 C  Resp: 16 18    Last Pain:  Filed Vitals:   10/14/15 1012  PainSc: 3                  Merrilee Ancona DANIEL

## 2015-10-14 NOTE — Anesthesia Procedure Notes (Signed)
Spinal Patient location during procedure: OR Start time: 10/14/2015 7:29 AM End time: 10/14/2015 7:38 AM Staffing Anesthesiologist: Heather RobertsSINGER, Kenson Groh Performed by: anesthesiologist  Preanesthetic Checklist Completed: patient identified, surgical consent, pre-op evaluation, timeout performed, IV checked, risks and benefits discussed and monitors and equipment checked Spinal Block Patient position: sitting Prep: DuraPrep Patient monitoring: cardiac monitor, continuous pulse ox and blood pressure Approach: midline Location: L2-3 Injection technique: single-shot Needle Needle type: Pencan  Needle gauge: 24 G Needle length: 9 cm Additional Notes Functioning IV was confirmed and monitors were applied. Sterile prep and drape, including hand hygiene and sterile gloves were used. The patient was positioned and the spine was prepped. The skin was anesthetized with lidocaine.  Free flow of clear CSF was obtained prior to injecting local anesthetic into the CSF.  The spinal needle aspirated freely following injection.  The needle was carefully withdrawn.  The patient tolerated the procedure well.

## 2015-10-15 ENCOUNTER — Encounter (HOSPITAL_COMMUNITY): Payer: Self-pay | Admitting: Family Medicine

## 2015-10-15 LAB — CBC
HCT: 27.1 % — ABNORMAL LOW (ref 36.0–46.0)
Hemoglobin: 8.9 g/dL — ABNORMAL LOW (ref 12.0–15.0)
MCH: 29.7 pg (ref 26.0–34.0)
MCHC: 32.8 g/dL (ref 30.0–36.0)
MCV: 90.3 fL (ref 78.0–100.0)
PLATELETS: 105 10*3/uL — AB (ref 150–400)
RBC: 3 MIL/uL — AB (ref 3.87–5.11)
RDW: 15.7 % — AB (ref 11.5–15.5)
WBC: 9.1 10*3/uL (ref 4.0–10.5)

## 2015-10-15 LAB — RPR: RPR: NONREACTIVE

## 2015-10-15 MED ORDER — OXYCODONE HCL 5 MG PO TABS
5.0000 mg | ORAL_TABLET | Freq: Once | ORAL | Status: AC
  Administered 2015-10-15: 5 mg via ORAL
  Filled 2015-10-15: qty 1

## 2015-10-15 MED ORDER — CYCLOBENZAPRINE HCL 5 MG PO TABS
5.0000 mg | ORAL_TABLET | Freq: Three times a day (TID) | ORAL | Status: DC | PRN
  Filled 2015-10-15: qty 1

## 2015-10-15 MED ORDER — IBUPROFEN 800 MG PO TABS
800.0000 mg | ORAL_TABLET | Freq: Four times a day (QID) | ORAL | Status: DC
  Administered 2015-10-15 – 2015-10-17 (×8): 800 mg via ORAL
  Filled 2015-10-15 (×9): qty 1

## 2015-10-15 NOTE — Progress Notes (Signed)
Post Partum Day 1/POD#1 Subjective:  Jeanette Obrien is a 35 y.o. [id number] 6211w0d s/p rLTCS.  No acute events overnight.  Pt reports problems with ambulating, voiding or po intake.  She denies nausea or vomiting.  Pain is poorly controlled.  She has had flatus.  Lochia Moderate.  Plan for birth control is no method.  Method of Feeding:Bottle  Objective: Blood pressure 128/75, pulse 99, temperature 97.8 F (36.6 C), temperature source Axillary, resp. rate 20, SpO2 100 %, unknown if currently breastfeeding.  Physical Exam:  General: alert, cooperative and no distress Lochia:normal flow Chest: normal WOB Heart: Regular rate Abdomen: +BS, soft, mild TTP (appropriate) Uterine Fundus: firm, below umbilicus DVT Evaluation: No evidence of DVT seen on physical exam. Extremities: No edema   Recent Labs  10/14/15 0645 10/15/15 0445  HGB 10.0* 8.9*  HCT 30.9* 27.1*    Assessment/Plan:  ASSESSMENT: Jeanette Obrien is a 35 y.o. [id number] 2211w0d s/p rLTCS  Plan for discharge tomorrow Continue routine PP care Breastfeeding support PRN  #Postoperative Pain - Pt with increased pain due to rLTCS, also intolerant of pain likely 2/2 to methadone - flexeril prn - ibuprofen 800mg  - will given 1 time oxy5mg  (patient is currently using 10mg  every time). I have discussed that narcotics are not a good long term solution to her pain and managed expectations of pain control. Discussed importance of walking. Set expectation that oxy 5mg  will be a one time dose only and patient voiced understanding   LOS: 1 day   Federico FlakeKimberly Niles Jenevie Casstevens 10/15/2015, 11:54 AM

## 2015-10-15 NOTE — Progress Notes (Signed)
Pharmacist-Physician Communication  Concern: A possible Drug-Drug Interaction  Description: Coadministration of cyclobenzaprine with sertraline may increase serotonergic activity. Serotonin syndrome may result. Caution should be exercised with this combination.  Recommendation(s): Caution should be used when sertraline and cyclobenzaprine are used concurrently. If coadministration of sertraline and cyclobenzaprine is clinically warranted, monitor for signs and symptoms of serotonin syndrome (e.g. confusion, agitation , diaphoresis, tachycardia, hyperthermia, tremor, etc.) especially following treatment initiation and following dose increases.   Thank You,

## 2015-10-15 NOTE — Progress Notes (Signed)
CSW attempted to meet with [name] twice today to offer support and complete assessment due to baby's admission to NICU as well as substance use and mental health concerns, but on first attempt, [name] was not in her room and on second, she was meeting with baby's NNP to discuss baby's condition.  CSW will attempt again at a later time.

## 2015-10-15 NOTE — Progress Notes (Signed)
Pt requesting for pain med. Pt sleepy, groggy, lethargic and unable to stay awake for more than few second and have a conversation. Pt states she's really tired and hasn't sleep in a while. It appears pt may have taken some type of "illegal drug" When asked, pt states she has not taken any thing and she's just tired.  Will continue to monitor

## 2015-10-15 NOTE — Progress Notes (Signed)
DR Lyndel SafeKIMBERLY NEWTON  Notified of   pts sleeping and 3 small pills noted at bedside  Looks powdery like they were held in her mouth and removed  Pills taken and  Discarded   Will continue to observe and not medicate  While  Appearing   Somnolent  See the written copy of this report in the patient's paper medical record.  These results did not interface directly into the electronic medical record and are summarized here. VS as recorded

## 2015-10-15 NOTE — Anesthesia Postprocedure Evaluation (Signed)
Anesthesia Post Note  Patient: Jeanette Obrien  Procedure(s) Performed: Procedure(s) (LRB): CESAREAN SECTION (N/A)  Patient location during evaluation: [facility] Anesthesia Type: Spinal Level of consciousness: awake, awake and alert, oriented and patient cooperative Pain management: pain level controlled Vital Signs Assessment: post-procedure vital signs reviewed and stable Respiratory status: spontaneous breathing Cardiovascular status: stable Postop Assessment: no headache, no backache, patient able to bend at knees, no signs of nausea or vomiting and adequate PO intake Anesthetic complications: no    Last Vitals:  Filed Vitals:   10/15/15 0116 10/15/15 0548  BP: 130/65 120/58  Pulse: 64 70  Temp: 37.1 C 36.7 C  Resp: 16 16    Last Pain:  Filed Vitals:   10/15/15 0658  PainSc: Asleep                 Bradyn Soward C

## 2015-10-15 NOTE — Addendum Note (Signed)
Addendum  created 10/15/15 0806 by Suella Groveoderick C Adreanne Yono, CRNA   Modules edited: Clinical Notes   Clinical Notes:  File: [id number]

## 2015-10-16 LAB — RAPID URINE DRUG SCREEN, HOSP PERFORMED
AMPHETAMINES: NOT DETECTED
BENZODIAZEPINES: NOT DETECTED
Barbiturates: NOT DETECTED
Cocaine: NOT DETECTED
OPIATES: NOT DETECTED
TETRAHYDROCANNABINOL: NOT DETECTED

## 2015-10-16 MED ORDER — FERROUS SULFATE 325 (65 FE) MG PO TABS
325.0000 mg | ORAL_TABLET | Freq: Two times a day (BID) | ORAL | Status: DC
  Administered 2015-10-17: 325 mg via ORAL
  Filled 2015-10-16: qty 1
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
## 2015-10-16 NOTE — Progress Notes (Signed)
Pt requesting for more pain med. Don't feel comfortable giving pt more pain meds due to her behavior. Dr. Alvester MorinNewton notified. MD stated to give pt ambien and continue with oxy 1 tab when pt request for pain med.
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
## 2015-10-16 NOTE — Progress Notes (Signed)
CSW attempted to meet with [name] to complete assessment for NICU admission and hx of mental health concerns and substance use.  CSW knocked loudly on the door and asked to come in.  CSW cracked door open and [name] is sleeping.  CSW spoke her name loudly, as report has been that she is difficult to rouse.  [name] did not move.  CSW asked RN to notify CSW when [name] is awake.

## 2015-10-16 NOTE — Lactation Note (Addendum)
This note was copied from a baby's chart. Lactation Consultation Note  Patient Name: Jeanette Obrien KPVVZ'S Date: 10/16/2015 Reason for consult: Initial assessment;NICU baby   Initial Consult with mom of 53 hour old infant in NICU. Informed by mom's RN that mom is requesting Rolfe visit as she knows BM is better for her baby. Reviewed mom's Chart and medications. All medications listed on MAR falls in the L1-L3 catagories. The L3 meds of Ambien and Flexeril are PRN Medications. History is significant for cocaine and heroin use and mom is currently on Methadone.   Attended morning rounds in NICU with T. Hunsucker, NNP and Dr. Higinio Roger to discuss whether it is best for mom to BF this infant, informed NICU of the 3 white pills that were found at bedside yesterday and decision was made for mom not to BF. Dr. Higinio Roger spoke with mom after rounds and told her it was OK for her to begin pumping and to freeze her milk for later use, the milk will not be used at this time.  Met with mom at infant's bedside in NICU, she asked if it was OK to BF since her HCV shows "its almost positive, but not quite". Discussed with mom that it is ok to BF with Hep C as long as there is no cracked or bleeding nipples and there is no blood in BM. Mom voiced understanding.  Mom was very talkative about her mom, older daughter, and her BF. Her speech was noted to be rapid at times, and slow at times. She was noted to have a drowsy appearance throughout meeting. She needed to be redirected often to stay on topic.   She reports her breasts are feeling full today and getting firm. Discussed that I will come by her room later to assist her in setting up DEBP, She does not have a pump at home and is not currently active with [facility], although she reports she is planning to apply. Briefly discussed [facility] loaner program with mom. She may be d/c home today or tomorrow.  When I asked mom about her drug use, she reports that she has not had any  illegal drugs in a "few months", when I asked her "do you mean 2 months?" , she says "More like 3". I advised mom that if there is any illegal drugs in her BM, her infant should not consume the milk and the milk should be dumped down the drain, not frozen. I told her that anything the mom ingests passes through the breast milk and that cocaine and heroin do pass into breast milk, and that an infant can be severely injured or could die from consumption of BM with cocaine, heroin or any other illegal substances. Mom was tearful several times throughout our meeting and says she is trying to turn her life around and has attended [facility] recently and is currently being followed OP. She said "This baby gives me another chance at life". Per SW mom had a + drug screen for Cocaine on September 25, 2015. UDS for mom this morning is noted to be negative.  When I asked mom "Can you tell me about the 3 white pills that were found on your bedside table yesterday? Mom informed me that she was given pain medication yesterday "the only time they have given me 15 mg" She says she has a hard time swallowing pills and she tried to take all 3 at the same time, she then said she couldn't swallow the  pills and spit the pills out and laid them on a cup lid on her bedside table. She then exclaimed " I'm glad we finally figured all of that out, I guess that's why I woke up in so much pain last night"  All pertinent information obtained in interview in relation to infant safety and breast milk consumption was communicated to Raynald Blend, NNP via phone and in person. Will follow up later at bedside with patient to begin pumping.     Maternal Data    Feeding Feeding Type: Formula Nipple Type: Slow - flow Length of feed: 30 min  LATCH Score/Interventions                      Lactation Tools Discussed/Used WIC Program: No (Plans to apply)   Consult Status Consult Status: Follow-up Date: 10/16/15 Follow-up  type: In-patient    Jeanette Obrien 10/16/2015, 12:06 PM

## 2015-10-17 DIAGNOSIS — F1120 Opioid dependence, uncomplicated: Secondary | ICD-10-CM | POA: Diagnosis present

## 2015-10-17 DIAGNOSIS — O99320 Drug use complicating pregnancy, unspecified trimester: Secondary | ICD-10-CM

## 2015-10-17 MED ORDER — IBUPROFEN 800 MG PO TABS: 800.0000 mg | ORAL_TABLET | Freq: Four times a day (QID) | ORAL | Status: AC

## 2015-10-17 MED ORDER — GABAPENTIN 600 MG PO TABS: 600.0000 mg | ORAL_TABLET | Freq: Three times a day (TID) | ORAL | Status: AC

## 2015-10-17 MED ORDER — OXYCODONE HCL 5 MG PO TABS: ORAL_TABLET | ORAL | Status: AC

## 2015-10-17 MED ORDER — OXYCODONE-ACETAMINOPHEN 5-325 MG PO TABS: 1.0000 | ORAL_TABLET | Freq: Four times a day (QID) | ORAL | Status: DC | PRN

## 2015-10-17 MED ORDER — LEVETIRACETAM 500 MG PO TABS: 500.0000 mg | ORAL_TABLET | Freq: Three times a day (TID) | ORAL | Status: AC

## 2015-10-17 MED ORDER — SERTRALINE HCL 100 MG PO TABS: 100.0000 mg | ORAL_TABLET | Freq: Every day | ORAL | Status: AC

## 2015-10-17 NOTE — Procedures (Signed)
Verbally consented for PICC line removal.   Patient placed in trendelenburg. Removed dressing in a sterile fashion. Patient asked to hold breath and PICC was removed in a single pull with immediate placement of sterile gauze.  Placed vaseline gauze over area with sterile gauze over. Occlusive dressing placed.   Patient tolerated procedure well.   Federico FlakeKimberly Niles Aran Menning, MD , MPH, ABFM Family Medicine, OB Fellow [facility]

## 2015-10-17 NOTE — Discharge Summary (Signed)
OB Discharge Summary  Patient Name: Jeanette Obrien DOB: Oct 23, 1980 MRN: [id number]  Date of admission: 10/14/2015 Delivering MD: Shonna ChockWOUK, NOAH BEDFORD   Date of discharge: 10/17/2015  Admitting diagnosis: cpt [id number] - Previous c-section x 1, declined TOLAC Intrauterine pregnancy: 4572w0d     Secondary diagnosis:  Principal Problem:   Status post repeat low transverse cesarean section Active Problems:   Bipolar I disorder (HCC)   Polysubstance abuse   Seizure (HCC)   Current tobacco use   Supervision of high risk pregnancy, antepartum   AMA (advanced maternal age) multigravida 35+   Seizure disorder in pregnancy, antepartum (HCC)   Drug use affecting pregnancy, antepartum   Group B streptococcus urinary tract infection affecting pregnancy, antepartum   Methadone maintenance treatment affecting pregnancy, antepartum (HCC)  Additional problems: none     Discharge diagnosis: Term Pregnancy Delivered                                                                                                Post partum procedures:none  Augmentation: NA  Complications: None  Hospital course:  Sceduled C/S   35 y.o. yo [id number] at 4972w0d was admitted to the hospital 10/14/2015 for scheduled cesarean section with the following indication:Elective Repeat.  Membrane Rupture Time/Date: 7:54 AM ,10/14/2015   Patient delivered a Viable infant.10/14/2015  Details of operation can be found in separate operative note.  Pateint had an uncomplicated postpartum course.  She is ambulating, tolerating a regular diet, passing flatus, and urinating well. The patient had increased pain needs but reinforced that typical pain after C-section only requires 30 tablets of oxycodone and that we would not provide additional pills.  PICC line was placed before her C-section due to difficult vascular access and this was removed on the day of discharge. Patient is discharged home in stable condition on  10/17/2015.          Physical exam   Filed Vitals:   10/16/15 1259 10/16/15 1746 10/16/15 2119 10/17/15 0542  BP: 132/67 138/84 133/63 130/67  Pulse: 111 122 99 96  Temp: 99.6 F (37.6 C) 99.6 F (37.6 C) 99.6 F (37.6 C) 99.6 F (37.6 C)  TempSrc: Oral Oral Oral Oral  Resp: 18 18 18 16   Height:      Weight:      SpO2: 98% 98% 97% 97%   General: alert, cooperative and no distress Lochia: appropriate Uterine Fundus: firm Incision: Healing well with no significant drainage DVT Evaluation: No evidence of DVT seen on physical exam. Negative Homan's sign. Labs: Lab Results  Component Value Date   WBC 9.1 10/15/2015   HGB 8.9* 10/15/2015   HCT 27.1* 10/15/2015   MCV 90.3 10/15/2015   PLT 105* 10/15/2015   No flowsheet data found.  Discharge instruction: per After Visit Summary and "Baby and Me Booklet".  After visit meds:    Medication List    TAKE these medications        budesonide-formoterol 160-4.5 MCG/ACT inhaler  Commonly known as:  SYMBICORT  Inhale 2 puffs into the lungs 3 (three) times daily.     famotidine  20 MG tablet  Commonly known as:  PEPCID  Take 1 tablet (20 mg total) by mouth 2 (two) times daily.     gabapentin 600 MG tablet  Commonly known as:  NEURONTIN  Take 1 tablet (600 mg total) by mouth 3 (three) times daily.     ibuprofen 800 MG tablet  Commonly known as:  ADVIL,MOTRIN  Take 1 tablet (800 mg total) by mouth every 6 (six) hours.     levETIRAcetam 500 MG tablet  Commonly known as:  KEPPRA  Take 1 tablet (500 mg total) by mouth 3 (three) times daily.     methadone 10 MG/ML solution  Commonly known as:  DOLOPHINE  Take 99 mg by mouth daily.     ondansetron 4 MG disintegrating tablet  Commonly known as:  ZOFRAN ODT  Take 1 tablet (4 mg total) by mouth every 6 (six) hours as needed for nausea.     oxyCODONE 5 MG immediate release tablet  Commonly known as:  Oxy IR/ROXICODONE  Take 1-2 tablets every 6 hours as need for severe pain     prenatal multivitamin Tabs  tablet  Take 1 tablet by mouth daily at 12 noon.     PROAIR HFA 108 (90 Base) MCG/ACT inhaler  Generic drug:  albuterol  Inhale 2 puffs into the lungs every 6 (six) hours as needed.     promethazine 25 MG tablet  Commonly known as:  PHENERGAN  Take 1 tablet (25 mg total) by mouth every 6 (six) hours as needed for nausea.     sertraline 100 MG tablet  Commonly known as:  ZOLOFT  Take 1 tablet (100 mg total) by mouth daily.        Diet: routine diet  Activity: Advance as tolerated. Pelvic rest for 6 weeks.   Outpatient follow up:2 weeks Nexplanon placement and 6 weeks PP visit  Postpartum contraception: Nexplanon  Newborn Data: Live born female  Birth Weight: 6 lb 8.4 oz (2960 g) APGAR: 8, 8  Baby Feeding: Breast Disposition:NICU   10/17/2015 Federico Flake, MD

## 2015-10-17 NOTE — Progress Notes (Signed)
Pt is discharged in the care of boyfriend,R.N.   Escort. Denies any pain or discomfort. Discharged instructions with  Rx were given to pt with good understanding.Picc line was removed from rt. Arm. Pt tolerated procedure well.  Understands all instructions well.  Questions asked and answered. Discharged per ambulatory. No equipment needed for home use.

## 2015-10-17 NOTE — Discharge Instructions (Signed)
Etonogestrel implant What is this medicine? ETONOGESTREL (et oh noe JES trel) is a contraceptive (birth control) device. It is used to prevent pregnancy. It can be used for up to 3 years. This medicine may be used for other purposes; ask your health care provider or pharmacist if you have questions. What should I tell my health care provider before I take this medicine? They need to know if you have any of these conditions: -abnormal vaginal bleeding -blood vessel disease or blood clots -cancer of the breast, cervix, or liver -depression -diabetes -gallbladder disease -headaches -heart disease or recent heart attack -high blood pressure -high cholesterol -kidney disease -liver disease -renal disease -seizures -tobacco smoker -an unusual or allergic reaction to etonogestrel, other hormones, anesthetics or antiseptics, medicines, foods, dyes, or preservatives -pregnant or trying to get pregnant -breast-feeding How should I use this medicine? This device is inserted just under the skin on the inner side of your upper arm by a health care professional. Talk to your pediatrician regarding the use of this medicine in children. Special care may be needed. Overdosage: If you think you have taken too much of this medicine contact a poison control center or emergency room at once. NOTE: This medicine is only for you. Do not share this medicine with others. What if I miss a dose? This does not apply. What may interact with this medicine? Do not take this medicine with any of the following medications: -amprenavir -bosentan -fosamprenavir This medicine may also interact with the following medications: -barbiturate medicines for inducing sleep or treating seizures -certain medicines for fungal infections like ketoconazole and itraconazole -griseofulvin -medicines to treat seizures like carbamazepine, felbamate, oxcarbazepine, phenytoin,  topiramate -modafinil -phenylbutazone -rifampin -some medicines to treat HIV infection like atazanavir, indinavir, lopinavir, nelfinavir, tipranavir, ritonavir -St. John's wort This list may not describe all possible interactions. Give your health care provider a list of all the medicines, herbs, non-prescription drugs, or dietary supplements you use. Also tell them if you smoke, drink alcohol, or use illegal drugs. Some items may interact with your medicine. What should I watch for while using this medicine? This product does not protect you against HIV infection (AIDS) or other sexually transmitted diseases. You should be able to feel the implant by pressing your fingertips over the skin where it was inserted. Contact your doctor if you cannot feel the implant, and use a non-hormonal birth control method (such as condoms) until your doctor confirms that the implant is in place. If you feel that the implant may have broken or become bent while in your arm, contact your healthcare provider. What side effects may I notice from receiving this medicine? Side effects that you should report to your doctor or health care professional as soon as possible: -allergic reactions like skin rash, itching or hives, swelling of the face, lips, or tongue -breast lumps -changes in emotions or moods -depressed mood -heavy or prolonged menstrual bleeding -pain, irritation, swelling, or bruising at the insertion site -scar at site of insertion -signs of infection at the insertion site such as fever, and skin redness, pain or discharge -signs of pregnancy -signs and symptoms of a blood clot such as breathing problems; changes in vision; chest pain; severe, sudden headache; pain, swelling, warmth in the leg; trouble speaking; sudden numbness or weakness of the face, arm or leg -signs and symptoms of liver injury like dark yellow or brown urine; general ill feeling or flu-like symptoms; light-colored stools; loss of  appetite; nausea; right upper belly  pain; unusually weak or tired; yellowing of the eyes or skin -unusual vaginal bleeding, discharge -signs and symptoms of a stroke like changes in vision; confusion; trouble speaking or understanding; severe headaches; sudden numbness or weakness of the face, arm or leg; trouble walking; dizziness; loss of balance or coordination Side effects that usually do not require medical attention (Report these to your doctor or health care professional if they continue or are bothersome.): -acne -back pain -breast pain -changes in weight -dizziness -general ill feeling or flu-like symptoms -headache -irregular menstrual bleeding -nausea -sore throat -vaginal irritation or inflammation This list may not describe all possible side effects. Call your doctor for medical advice about side effects. You may report side effects to FDA at 1-800-FDA-1088. Where should I keep my medicine? This drug is given in a hospital or clinic and will not be stored at home. NOTE: This sheet is a summary. It may not cover all possible information. If you have questions about this medicine, talk to your doctor, pharmacist, or health care provider.    2016, Elsevier/Gold Standard. (2014-04-13 14:07:06)  Postpartum Care After Cesarean Delivery After you deliver your newborn (postpartum period), the usual stay in the hospital is 24-72 hours. If there were problems with your labor or delivery, or if you have other medical problems, you might be in the hospital longer.  While you are in the hospital, you will receive help and instructions on how to care for yourself and your newborn during the postpartum period.  While you are in the hospital:  It is normal for you to have pain or discomfort from the incision in your abdomen. Be sure to tell your nurses when you are having pain, where the pain is located, and what makes the pain worse.  If you are breastfeeding, you may feel uncomfortable  contractions of your uterus for a couple of weeks. This is normal. The contractions help your uterus get back to normal size.  It is normal to have some bleeding after delivery.  For the first 1-3 days after delivery, the flow is red and the amount may be similar to a period.  It is common for the flow to start and stop.  In the first few days, you may pass some small clots. Let your nurses know if you begin to pass large clots or your flow increases.  Do not  flush blood clots down the toilet before having the nurse look at them.  During the next 3-10 days after delivery, your flow should become more watery and pink or brown-tinged in color.  Ten to fourteen days after delivery, your flow should be a small amount of yellowish-white discharge.  The amount of your flow will decrease over the first few weeks after delivery. Your flow may stop in 6-8 weeks. Most women have had their flow stop by 12 weeks after delivery.  You should change your sanitary pads frequently.  Wash your hands thoroughly with soap and water for at least 20 seconds after changing pads, using the toilet, or before holding or feeding your newborn.  Your intravenous (IV) tubing will be removed when you are drinking enough fluids.  The urine drainage tube (urinary catheter) that was inserted before delivery may be removed within 6-8 hours after delivery or when feeling returns to your legs. You should feel like you need to empty your bladder within the first 6-8 hours after the catheter has been removed.  In case you become weak, lightheaded, or faint, call your  nurse before you get out of bed for the first time and before you take a shower for the first time.  Within the first few days after delivery, your breasts may begin to feel tender and full. This is called engorgement. Breast tenderness usually goes away within 48-72 hours after engorgement occurs. You may also notice milk leaking from your breasts. If you are  not breastfeeding, do not stimulate your breasts. Breast stimulation can make your breasts produce more milk.  Spending as much time as possible with your newborn is very important. During this time, you and your newborn can feel close and get to know each other. Having your newborn stay in your room (rooming in) will help to strengthen the bond with your newborn. It will give you time to get to know your newborn and become comfortable caring for your newborn.  Your hormones change after delivery. Sometimes the hormone changes can temporarily cause you to feel sad or tearful. These feelings should not last more than a few days. If these feelings last longer than that, you should talk to your caregiver.  If desired, talk to your caregiver about methods of family planning or contraception.  Talk to your caregiver about immunizations. Your caregiver may want you to have the following immunizations before leaving the hospital:  Tetanus, diphtheria, and pertussis (Tdap) or tetanus and diphtheria (Td) immunization. It is very important that you and your family (including grandparents) or others caring for your newborn are up-to-date with the Tdap or Td immunizations. The Tdap or Td immunization can help protect your newborn from getting ill.  Rubella immunization.  Varicella (chickenpox) immunization.  Influenza immunization. You should receive this annual immunization if you did not receive the immunization during your pregnancy.   This information is not intended to replace advice given to you by your health care provider. Make sure you discuss any questions you have with your health care provider.   Document Released: 03/23/2012 Document Reviewed: 03/23/2012 Elsevier Interactive Patient Education [employer]2016 [employer] Inc.

## 2015-10-19 ENCOUNTER — Encounter: Payer: Self-pay | Admitting: *Deleted

## 2015-10-21 ENCOUNTER — Telehealth: Payer: Self-pay | Admitting: *Deleted

## 2015-10-21 NOTE — Telephone Encounter (Signed)
Attempted to call patient back but unable to reach her. Will retry later.

## 2015-10-21 NOTE — Telephone Encounter (Signed)
Patient called stating that she needs pain medication following her csection last week. She is having a lot of pain.

## 2015-10-22 NOTE — Telephone Encounter (Signed)
Called pt and discussed her request for additional pain medication. She states that she has taken all of the Rx for Oxycodone which was given on 4/6. She has been taking the ibuprofen as well as Tylenol every 6-8 hrs. She will be getting her Rx for Gabapentin filled today. I advised pt that typically we do not prescribe narcotic medication for more than 1 week after surgery. If pt feels her pain is severe and debilitating, she should call back or go to [facility] for evaluation. Pt voiced understanding and did not report severe pain. She agrees that it is slowly becoming less each day.

## 2015-11-11 ENCOUNTER — Ambulatory Visit: Payer: Medicaid Other | Admitting: Advanced Practice Midwife

## 2015-11-25 ENCOUNTER — Ambulatory Visit: Payer: Medicaid Other | Admitting: Obstetrics & Gynecology

## 2015-12-11 ENCOUNTER — Ambulatory Visit: Payer: Medicaid Other | Admitting: Family

## 2016-05-21 IMAGING — XA IR US GUIDE VASC ACCESS RIGHT
1 series · 1 of 1 positions shown · IV contrast (agent unspecified)
Comparison: none

INDICATION: This is a 38 week and 4 day gravid female who presents today for a
PICC line placement for Cesarian section on [REDACTED] October 14, 2015.
She has a history of poor venous access, hence the request for a
PICC line.

EXAM:
ULTRASOUND AND FLUOROSCOPIC GUIDED PICC LINE INSERTION
MEDICATIONS:
None.
CONTRAST:  None
FLUOROSCOPY TIME:  6 seconds (1 mGy)
COMPLICATIONS:
None immediate.
TECHNIQUE: The procedure, risks, benefits, and alternatives were explained to
the patient and informed written consent was obtained. A timeout was
performed prior to the initiation of the procedure.

[Series 300: ir fluoro guide cv midline picc right · 1 of 1 slices shown]
[im 1/1]
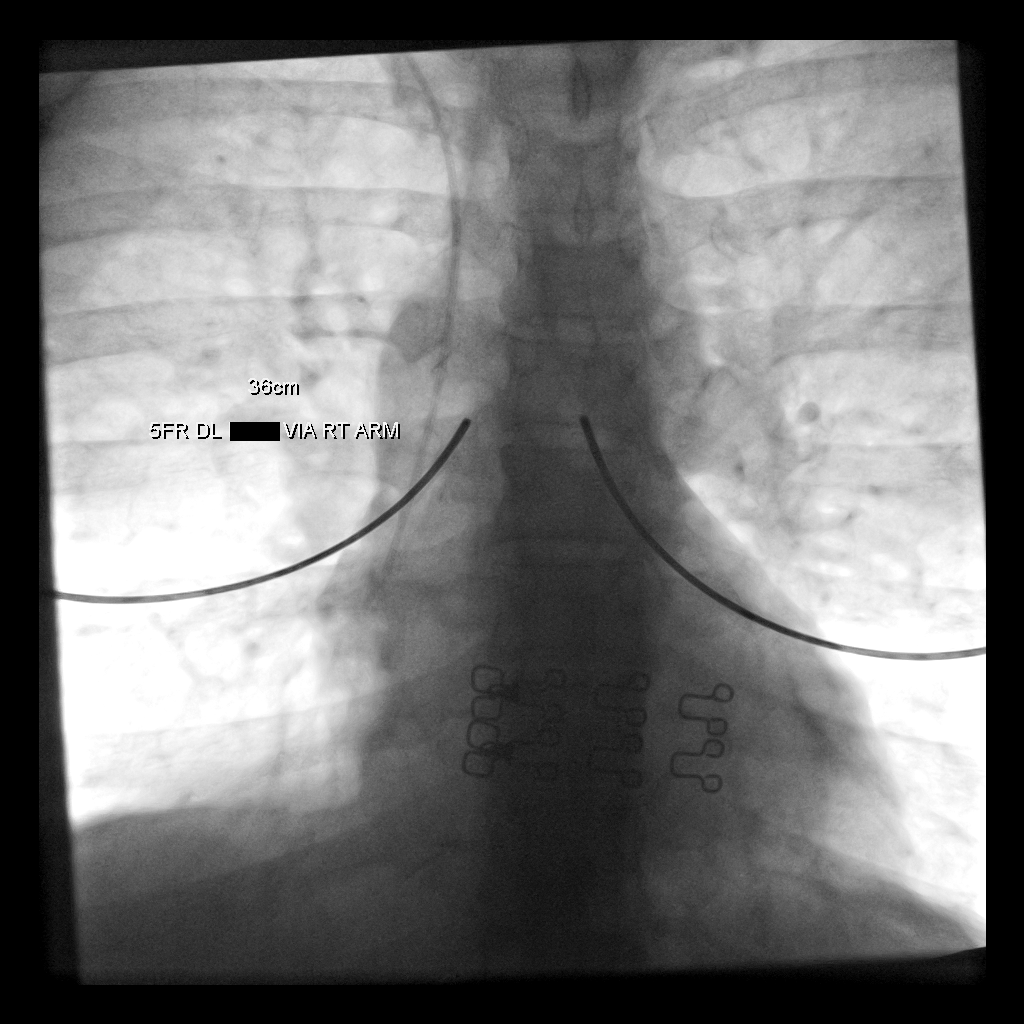

[1 of 1 positions shown; findings below may reference images not displayed]

The right upper extremity was prepped with chlorhexidine in a
sterile fashion, and a sterile drape was applied covering the
operative field. Maximum barrier sterile technique with sterile
gowns and gloves were used for the procedure. Maximum protection
with lead was used to cover the patient's abdomen as she was gravid.
A timeout was performed prior to the initiation of the procedure.
Local anesthesia was provided with 1% lidocaine.

Under direct ultrasound guidance, the right brachial vein was
accessed with a micropuncture kit after the overlying soft tissues
were anesthetized with 1% lidocaine. An ultrasound image was saved
for documentation purposes. A guidewire was advanced to the level of
the superior caval-atrial junction for measurement purposes and the
PICC line was cut to length. A peel-away sheath was placed and a 36
cm, 5 French, dual lumen was inserted to level of the superior
caval-atrial junction. A post procedure spot fluoroscopic was
obtained. The catheter easily aspirated and flushed and was secured
in place. A dressing was placed. The patient tolerated the procedure
well without immediate post procedural complication.
FINDINGS: After catheter placement, the tip lies within the superior
cavoatrial junction. The catheter aspirates and flushes normally and
is ready for immediate use.
IMPRESSION: Successful ultrasound and fluoroscopic guided placement of a right
brachial vein approach, 36 cm, 5 French, dual lumen PICC with tip at
the superior caval-atrial junction. The PICC line is ready for
immediate use.

## 2017-09-20 ENCOUNTER — Encounter: Payer: Self-pay | Admitting: *Deleted
